# Patient Record
Sex: Female | Born: 1955 | Race: White | Hispanic: No | Smoking: Never smoker
Health system: Southern US, Community
[De-identification: ages and names within clinical notes are randomized; demographics above are authoritative.]

## PROBLEM LIST (undated history)

## (undated) DIAGNOSIS — I10 Essential (primary) hypertension: Secondary | ICD-10-CM

## (undated) HISTORY — PX: TEAR DUCT PROBING: SHX793

## (undated) HISTORY — DX: Essential (primary) hypertension: I10

---

## 2004-10-02 ENCOUNTER — Ambulatory Visit: Payer: Self-pay | Admitting: Family Medicine

## 2004-10-14 ENCOUNTER — Ambulatory Visit: Payer: Self-pay | Admitting: Family Medicine

## 2004-10-14 ENCOUNTER — Other Ambulatory Visit: Admission: RE | Admit: 2004-10-14 | Discharge: 2004-10-14 | Payer: Self-pay | Admitting: Family Medicine

## 2004-10-24 ENCOUNTER — Ambulatory Visit: Payer: Self-pay

## 2004-10-28 ENCOUNTER — Ambulatory Visit: Payer: Self-pay | Admitting: Family Medicine

## 2004-11-10 ENCOUNTER — Encounter: Admission: RE | Admit: 2004-11-10 | Discharge: 2004-11-10 | Payer: Self-pay | Admitting: Family Medicine

## 2004-11-13 ENCOUNTER — Ambulatory Visit: Payer: Self-pay | Admitting: Family Medicine

## 2004-11-18 ENCOUNTER — Encounter: Admission: RE | Admit: 2004-11-18 | Discharge: 2004-11-18 | Payer: Self-pay | Admitting: Family Medicine

## 2004-12-01 ENCOUNTER — Inpatient Hospital Stay (HOSPITAL_COMMUNITY): Admission: AD | Admit: 2004-12-01 | Discharge: 2004-12-02 | Payer: Self-pay | Admitting: Emergency Medicine

## 2004-12-01 ENCOUNTER — Ambulatory Visit: Payer: Self-pay | Admitting: Family Medicine

## 2004-12-02 ENCOUNTER — Encounter: Payer: Self-pay | Admitting: Cardiology

## 2004-12-17 ENCOUNTER — Ambulatory Visit: Payer: Self-pay | Admitting: Family Medicine

## 2005-03-03 ENCOUNTER — Ambulatory Visit: Payer: Self-pay | Admitting: Family Medicine

## 2005-06-11 ENCOUNTER — Ambulatory Visit: Payer: Self-pay | Admitting: Family Medicine

## 2005-06-16 ENCOUNTER — Ambulatory Visit: Payer: Self-pay | Admitting: Family Medicine

## 2005-09-21 ENCOUNTER — Ambulatory Visit: Payer: Self-pay | Admitting: Family Medicine

## 2005-09-22 ENCOUNTER — Ambulatory Visit: Payer: Self-pay | Admitting: Family Medicine

## 2005-09-24 ENCOUNTER — Ambulatory Visit: Payer: Self-pay | Admitting: Family Medicine

## 2006-02-02 ENCOUNTER — Ambulatory Visit: Payer: Self-pay | Admitting: Family Medicine

## 2006-06-28 ENCOUNTER — Encounter: Admission: RE | Admit: 2006-06-28 | Discharge: 2006-06-28 | Payer: Self-pay | Admitting: Family Medicine

## 2006-07-12 ENCOUNTER — Other Ambulatory Visit: Admission: RE | Admit: 2006-07-12 | Discharge: 2006-07-12 | Payer: Self-pay | Admitting: Family Medicine

## 2006-07-12 ENCOUNTER — Encounter: Payer: Self-pay | Admitting: Family Medicine

## 2006-07-12 ENCOUNTER — Ambulatory Visit: Payer: Self-pay | Admitting: Family Medicine

## 2006-07-12 LAB — CONVERTED CEMR LAB
AST: 17 units/L (ref 0–37)
CO2: 31 meq/L (ref 19–32)
Chloride: 104 meq/L (ref 96–112)
Creatinine, Ser: 1 mg/dL (ref 0.4–1.2)
Eosinophil percent: 2.9 % (ref 0.0–5.0)
GFR calc non Af Amer: 62 mL/min
Glucose, Bld: 107 mg/dL — ABNORMAL HIGH (ref 70–99)
HCT: 37.9 % (ref 36.0–46.0)
MCHC: 32.4 g/dL (ref 30.0–36.0)
MCV: 66.4 fL — ABNORMAL LOW (ref 78.0–100.0)
Monocytes Absolute: 0.2 10*3/uL (ref 0.2–0.7)
Neutro Abs: 3.6 10*3/uL (ref 1.4–7.7)
Neutrophils Relative %: 63.4 % (ref 43.0–77.0)
RBC: 5.7 M/uL — ABNORMAL HIGH (ref 3.87–5.11)
Sodium: 141 meq/L (ref 135–145)

## 2006-09-09 ENCOUNTER — Ambulatory Visit: Payer: Self-pay | Admitting: Internal Medicine

## 2006-09-23 ENCOUNTER — Ambulatory Visit: Payer: Self-pay | Admitting: Internal Medicine

## 2006-12-08 ENCOUNTER — Encounter: Payer: Self-pay | Admitting: Family Medicine

## 2006-12-08 DIAGNOSIS — I10 Essential (primary) hypertension: Secondary | ICD-10-CM | POA: Insufficient documentation

## 2007-06-30 ENCOUNTER — Encounter: Admission: RE | Admit: 2007-06-30 | Discharge: 2007-06-30 | Payer: Self-pay | Admitting: Family Medicine

## 2007-07-07 ENCOUNTER — Encounter (INDEPENDENT_AMBULATORY_CARE_PROVIDER_SITE_OTHER): Payer: Self-pay | Admitting: *Deleted

## 2007-08-09 ENCOUNTER — Ambulatory Visit: Payer: Self-pay | Admitting: Family Medicine

## 2007-08-16 ENCOUNTER — Telehealth (INDEPENDENT_AMBULATORY_CARE_PROVIDER_SITE_OTHER): Payer: Self-pay | Admitting: *Deleted

## 2007-08-24 ENCOUNTER — Ambulatory Visit: Payer: Self-pay | Admitting: Family Medicine

## 2007-08-24 DIAGNOSIS — J069 Acute upper respiratory infection, unspecified: Secondary | ICD-10-CM | POA: Insufficient documentation

## 2007-11-22 ENCOUNTER — Other Ambulatory Visit: Admission: RE | Admit: 2007-11-22 | Discharge: 2007-11-22 | Payer: Self-pay | Admitting: Family Medicine

## 2007-11-22 ENCOUNTER — Encounter: Payer: Self-pay | Admitting: Family Medicine

## 2007-11-22 ENCOUNTER — Ambulatory Visit: Payer: Self-pay | Admitting: Family Medicine

## 2007-11-28 ENCOUNTER — Encounter (INDEPENDENT_AMBULATORY_CARE_PROVIDER_SITE_OTHER): Payer: Self-pay | Admitting: *Deleted

## 2007-12-05 ENCOUNTER — Encounter (INDEPENDENT_AMBULATORY_CARE_PROVIDER_SITE_OTHER): Payer: Self-pay | Admitting: *Deleted

## 2008-01-19 ENCOUNTER — Encounter: Payer: Self-pay | Admitting: Family Medicine

## 2008-05-31 ENCOUNTER — Ambulatory Visit: Payer: Self-pay | Admitting: Family Medicine

## 2008-07-11 ENCOUNTER — Ambulatory Visit: Payer: Self-pay | Admitting: Family Medicine

## 2008-07-18 ENCOUNTER — Encounter (INDEPENDENT_AMBULATORY_CARE_PROVIDER_SITE_OTHER): Payer: Self-pay | Admitting: *Deleted

## 2008-07-23 ENCOUNTER — Encounter: Admission: RE | Admit: 2008-07-23 | Discharge: 2008-07-23 | Payer: Self-pay | Admitting: Family Medicine

## 2008-07-26 ENCOUNTER — Encounter (INDEPENDENT_AMBULATORY_CARE_PROVIDER_SITE_OTHER): Payer: Self-pay | Admitting: *Deleted

## 2008-11-23 ENCOUNTER — Ambulatory Visit: Payer: Self-pay | Admitting: Family Medicine

## 2008-11-24 ENCOUNTER — Encounter: Payer: Self-pay | Admitting: Family Medicine

## 2008-11-26 ENCOUNTER — Encounter (INDEPENDENT_AMBULATORY_CARE_PROVIDER_SITE_OTHER): Payer: Self-pay | Admitting: *Deleted

## 2008-12-03 ENCOUNTER — Ambulatory Visit: Payer: Self-pay | Admitting: Family Medicine

## 2008-12-03 ENCOUNTER — Encounter: Payer: Self-pay | Admitting: Family Medicine

## 2008-12-03 ENCOUNTER — Other Ambulatory Visit: Admission: RE | Admit: 2008-12-03 | Discharge: 2008-12-03 | Payer: Self-pay | Admitting: Family Medicine

## 2008-12-10 ENCOUNTER — Encounter (INDEPENDENT_AMBULATORY_CARE_PROVIDER_SITE_OTHER): Payer: Self-pay | Admitting: *Deleted

## 2009-05-21 ENCOUNTER — Ambulatory Visit: Payer: Self-pay | Admitting: Family Medicine

## 2009-05-21 DIAGNOSIS — S61209A Unspecified open wound of unspecified finger without damage to nail, initial encounter: Secondary | ICD-10-CM | POA: Insufficient documentation

## 2009-05-30 ENCOUNTER — Telehealth (INDEPENDENT_AMBULATORY_CARE_PROVIDER_SITE_OTHER): Payer: Self-pay | Admitting: *Deleted

## 2009-09-30 ENCOUNTER — Encounter: Admission: RE | Admit: 2009-09-30 | Discharge: 2009-09-30 | Payer: Self-pay | Admitting: Family Medicine

## 2009-10-01 ENCOUNTER — Encounter: Payer: Self-pay | Admitting: Family Medicine

## 2009-11-25 ENCOUNTER — Telehealth (INDEPENDENT_AMBULATORY_CARE_PROVIDER_SITE_OTHER): Payer: Self-pay | Admitting: *Deleted

## 2010-02-13 ENCOUNTER — Telehealth: Payer: Self-pay | Admitting: Family Medicine

## 2010-04-03 ENCOUNTER — Ambulatory Visit: Payer: Self-pay | Admitting: Family Medicine

## 2010-04-03 ENCOUNTER — Other Ambulatory Visit: Admission: RE | Admit: 2010-04-03 | Discharge: 2010-04-03 | Payer: Self-pay | Admitting: Family Medicine

## 2010-04-03 DIAGNOSIS — Z780 Asymptomatic menopausal state: Secondary | ICD-10-CM | POA: Insufficient documentation

## 2010-04-09 ENCOUNTER — Telehealth (INDEPENDENT_AMBULATORY_CARE_PROVIDER_SITE_OTHER): Payer: Self-pay | Admitting: *Deleted

## 2010-08-22 ENCOUNTER — Ambulatory Visit: Payer: Self-pay | Admitting: Family Medicine

## 2010-08-22 DIAGNOSIS — M25569 Pain in unspecified knee: Secondary | ICD-10-CM | POA: Insufficient documentation

## 2010-08-30 ENCOUNTER — Encounter: Payer: Self-pay | Admitting: Family Medicine

## 2010-10-05 ENCOUNTER — Encounter: Payer: Self-pay | Admitting: Family Medicine

## 2010-10-12 LAB — CONVERTED CEMR LAB
AST: 15 units/L (ref 0–37)
Albumin: 4.1 g/dL (ref 3.5–5.2)
Albumin: 4.3 g/dL (ref 3.5–5.2)
Albumin: 4.6 g/dL (ref 3.5–5.2)
BUN: 10 mg/dL (ref 6–23)
BUN: 13 mg/dL (ref 6–23)
BUN: 14 mg/dL (ref 6–23)
Basophils Relative: 0.5 % (ref 0.0–1.0)
Basophils Relative: 0.6 % (ref 0.0–3.0)
Bilirubin, Direct: 0.2 mg/dL (ref 0.0–0.3)
Bilirubin, Direct: 0.2 mg/dL (ref 0.0–0.3)
CO2: 31 meq/L (ref 19–32)
CO2: 32 meq/L (ref 19–32)
Calcium: 9 mg/dL (ref 8.4–10.5)
Calcium: 9.4 mg/dL (ref 8.4–10.5)
Chloride: 103 meq/L (ref 96–112)
Cholesterol: 167 mg/dL (ref 0–200)
Creatinine, Ser: 0.9 mg/dL (ref 0.4–1.2)
Creatinine, Ser: 0.9 mg/dL (ref 0.4–1.2)
Creatinine, Ser: 1 mg/dL (ref 0.4–1.2)
Direct LDL: 116.7 mg/dL
Eosinophils Absolute: 0.2 10*3/uL (ref 0.0–0.7)
Eosinophils Relative: 2 % (ref 0.0–5.0)
Estradiol: 25.5 pg/mL
GFR calc Af Amer: 75 mL/min
Glucose, Bld: 100 mg/dL — ABNORMAL HIGH (ref 70–99)
Glucose, Urine, Semiquant: NEGATIVE
HCT: 38.7 % (ref 36.0–46.0)
HDL: 40.3 mg/dL (ref >39.0)
Hemoglobin: 12.7 g/dL (ref 12.0–15.0)
Hgb A1c MFr Bld: 6.1 % (ref 4.6–6.5)
Ketones, urine, test strip: NEGATIVE
LDL Cholesterol: 101 mg/dL — ABNORMAL HIGH (ref 0–99)
MCHC: 32.3 g/dL (ref 30.0–36.0)
MCV: 65.4 fL — ABNORMAL LOW (ref 78.0–100.0)
MCV: 67.4 fL — ABNORMAL LOW (ref 78.0–100.0)
Monocytes Absolute: 0.3 10*3/uL (ref 0.1–1.0)
Monocytes Absolute: 0.3 10*3/uL (ref 0.1–1.0)
Monocytes Relative: 5.1 % (ref 3.0–12.0)
Monocytes Relative: 5.7 % (ref 3.0–11.0)
Neutro Abs: 3.6 10*3/uL (ref 1.4–7.7)
Neutrophils Relative %: 62 % (ref 43.0–77.0)
Nitrite: NEGATIVE
Platelets: 336 10*3/uL (ref 150–400)
RBC: 5.7 M/uL — ABNORMAL HIGH (ref 3.87–5.11)
RBC: 5.96 M/uL — ABNORMAL HIGH (ref 3.87–5.11)
RDW: 13.7 % (ref 11.5–14.6)
Specific Gravity, Urine: <1.005
TSH: 1.44 microintl units/mL (ref 0.35–5.50)
TSH: 1.51 microintl units/mL (ref 0.35–5.50)
TSH: 1.83 microintl units/mL (ref 0.35–5.50)
Total CHOL/HDL Ratio: 4
Total CHOL/HDL Ratio: 4.2
Total Protein: 7 g/dL (ref 6.0–8.3)
Total Protein: 7.2 g/dL (ref 6.0–8.3)
Triglycerides: 79 mg/dL (ref 0–149)
Triglycerides: 85 mg/dL (ref 0.0–149.0)
Triglycerides: 98 mg/dL (ref 0–149)
VLDL: 20 mg/dL (ref 0–40)
WBC: 5.7 10*3/uL (ref 4.5–10.5)
pH: 8

## 2010-10-14 NOTE — Progress Notes (Signed)
Summary: Refill Requests/appt  Phone Note Refill Request Call back at [phone] Message from:  Fax from Pharmacy on February 13, 2010 11:11 AM  Refills Requested: Medication #1:  MAXZIDE-25 37.5-25 MG  TABS 1 by mouth once daily.   Dosage confirmed as above?Dosage Confirmed   Brand Name Necessary? No   Supply Requested: 3 months   Last Refilled: 11/2008  Medication #2:  ACEON 4 MG  TABS 1 1/2 tabs by mouth once daily   Dosage confirmed as above?Dosage Confirmed   Brand Name Necessary? No   Supply Requested: 3 months [facility]  Next Appointment Scheduled: 7.21.11 Initial call taken by: Harold Barban,  February 13, 2010 11:11 AM  Follow-up for Phone Call        last ov- 05/2009. Army Fossa CMA  February 13, 2010 11:19 AM   Additional Follow-up for Phone Call Additional follow up Details #1::        pt needs appointment scheduled--- then refill x1 each Additional Follow-up by: Loreen Freud DO,  February 13, 2010 11:53 AM    Additional Follow-up for Phone Call Additional follow up Details #2::    Pt has appt April 03 2010. Army Fossa CMA  February 13, 2010 12:45 PM   Prescriptions: MAXZIDE-25 37.5-25 MG  TABS (TRIAMTERENE-HCTZ) 1 by mouth once daily  #90 x 0   Entered by:   Army Fossa CMA   Authorized by:   Loreen Freud DO   Signed by:   Army Fossa CMA on 02/13/2010   Method used:   Faxed to ...       [facility]       [address] [place], [place]  [address]       Ph: [phone]       Fax: [phone]   RxID:   [id number] ACEON 4 MG  TABS (PERINDOPRIL ERBUMINE) 1 1/2 tabs by mouth once daily  #135 x 0   Entered by:   Army Fossa CMA   Authorized by:   Loreen Freud DO   Signed by:   Army Fossa CMA on 02/13/2010   Method used:   Faxed to ...       [facility]       [address] [place], [place]  [address]       Ph: [phone]       Fax: [phone]   RxID:   [id number]

## 2010-10-14 NOTE — Progress Notes (Signed)
Summary:  lab results  Phone Note Outgoing Call   Details for Reason: estradiol--  postmenopausal vita d low-----vita D 50000 weekly #4  2 refills and take vita D3 2000u daily----recheck 3months  Summary of Call: left message to call  office......Marland KitchenFelecia Deloach CMA  April 09, 2010 2:50 PM  left message to call  Office.Marland KitchenMarland KitchenMarland KitchenFelecia Deloach CMA  April 10, 2010 12:56 PM     New/Updated Medications: VITAMIN D (ERGOCALCIFEROL) 50000 UNIT CAPS (ERGOCALCIFEROL) Take 1 tab  weekly Prescriptions: VITAMIN D (ERGOCALCIFEROL) 50000 UNIT CAPS (ERGOCALCIFEROL) Take 1 tab  weekly  #4 x 2   Entered by:   Jeremy Johann CMA   Authorized by:   Loreen Freud DO   Signed by:   Jeremy Johann CMA on 04/10/2010   Method used:   Faxed to ...       [facility] [address] [id number] (retail)       [address] [place], [place]  [address]       Ph: [phone]       Fax: [phone]   RxID:   [id number]

## 2010-10-14 NOTE — Assessment & Plan Note (Signed)
Summary: fell middle of nov-hurt left knee--still swollen...sph   Vital Signs:  Patient profile:   55 year old female Menstrual status:  regular Weight:      261.2 pounds Temp:     97.9 degrees F oral BP sitting:   126 / 76  (left arm) Cuff size:   large  Vitals Entered By: Almeta Monas CMA Duncan Dull) (August 22, 2010 10:38 AM) CC: c/o swelling to the left knee after a fall x3weeks ago   History of Present Illness:  Injury      This is a 55 year old woman who presents with An injury.  The symptoms began 3 weeks ago.  Pt fell about 3 weeks ago in parking lot in rain and L knee still swelling.   No pain but it feels tight.  The patient reports injury to the left knee.  The patient also reports swelling.  The patient denies redness, tenderness, increased warmth deformity, blood loss, numbness, weakness, loss of sensation, coolness of extremity, and loss of consciousness.  The patient denies the following risk factors for significant bleeding: aspirin use, anticoagulant use, and history of bleeding disorder.  Screening for risk of abuse was negative.    Current Medications (verified): 1)  Aceon 4 Mg  Tabs (Perindopril Erbumine) .Marland Kitchen.. 1 1/2 Tabs By Mouth Once Daily 2)  Maxzide-25 37.5-25 Mg  Tabs (Triamterene-Hctz) .Marland Kitchen.. 1 By Mouth Once Daily 3)  Vitamin D (Ergocalciferol) 50000 Unit Caps (Ergocalciferol) .... Take 1 Tab  Weekly  Allergies (verified): 1)  ! Penicillin  Past History:  Past Medical History: Last updated: 12/08/2006 Hypertension  Past Surgical History: Last updated: 11/28/08 Denies surgical history tear duct surgery  Family History: Last updated: 2008-11-28 MOTHER:LIVING FATHER:DECEASED SISTERS:3 BROTHERS: NONE Family History Hypertension: 2 SISTERS, MOTHER, FATHER Family History Diabetes 1st degree relative MGM-- angina,  MI?  Social History: Last updated: 11/22/2007 Divorced Occupation: accounting  Never Smoked Alcohol use-yes Drug use-no Regular  exercise-no  Risk Factors: Alcohol Use: <1 (04/03/2010) Caffeine Use: 2 (04/03/2010) Diet: pt not following any kind of diet (04/03/2010) Exercise: no (04/03/2010)  Risk Factors: Smoking Status: never (04/03/2010) Passive Smoke Exposure: no (04/03/2010)  Family History: Reviewed history from 11/28/2008 and no changes required. MOTHER:LIVING FATHER:DECEASED SISTERS:3 BROTHERS: NONE Family History Hypertension: 2 SISTERS, MOTHER, FATHER Family History Diabetes 1st degree relative MGM-- angina,  MI?  Social History: Reviewed history from 11/22/2007 and no changes required. Divorced Occupation: Audiological scientist  Never Smoked Alcohol use-yes Drug use-no Regular exercise-no  Review of Systems      See HPI  Physical Exam  General:  Well-developed,well-nourished,in no acute distress; alert,appropriate and cooperative throughout examination Msk:  L knee swelling  no pain no joint warmth and no redness over joints.   Neurologic:  alert & oriented X3 and strength normal in all extremities.   Psych:  Oriented X3 and normally interactive.     Impression & Recommendations:  Problem # 1:  KNEE PAIN, LEFT (ICD-719.46)  Orders: T-Knee Left 2 view (73560TC)  Discussed strengthening exercises, use of ice or heat, and medications.   Complete Medication List: 1)  Aceon 4 Mg Tabs (Perindopril erbumine) .Marland Kitchen.. 1 1/2 tabs by mouth once daily 2)  Maxzide-25 37.5-25 Mg Tabs (Triamterene-hctz) .Marland Kitchen.. 1 by mouth once daily 3)  Vitamin D (ergocalciferol) 50000 Unit Caps (Ergocalciferol) .... Take 1 tab  weekly   Orders Added: 1)  T-Knee Left 2 view [73560TC] 2)  Est. Patient Level III [[address]]

## 2010-10-14 NOTE — Assessment & Plan Note (Signed)
Summary: Marissa Leblanc   Vital Signs:  Patient profile:   55 year old female Menstrual status:  regular Height:      66.25 inches Weight:      257 pounds BMI:     41.32 Temp:     98.6 degrees F oral Pulse rate:   76 / minute Resp:     18 per minute BP sitting:   130 / 82  (left arm)  Vitals Entered By: Jeremy Johann CMA (April 03, 2010 9:30 AM) CC: cpx,fasting, pap   History of Present Illness: Pt here for cpe, pap and labs. No complaints.   Hypertension follow-up      This is a 55 year old woman who presents for Hypertension follow-up.  The patient denies lightheadedness, urinary frequency, headaches, edema, impotence, rash, and fatigue.  The patient denies the following associated symptoms: chest pain, chest pressure, exercise intolerance, dyspnea, palpitations, syncope, leg edema, and pedal edema.  Compliance with medications (by patient report) has been near 100%.  The patient reports that dietary compliance has been fair.  The patient reports no exercise.  Adjunctive measures currently used by the patient include salt restriction.    Preventive Screening-Counseling & Management  Alcohol-Tobacco     Alcohol drinks/day: <1     Alcohol type: rare     Smoking Status: never     Passive Smoke Exposure: no  Caffeine-Diet-Exercise     Caffeine use/day: 2     Caffeine Counseling: not indicated; caffeine use is not excessive or problematic     Diet Comments: pt not following any kind of diet     Diet Counseling: to improve diet; diet is suboptimal     Does Patient Exercise: no     Exercise Counseling: to improve exercise regimen  Hep-HIV-STD-Contraception     Hepatitis Risk: no risk noted     HIV Risk: no     STD Risk: no risk noted     Dental Visit-last 6 months yes     Dental Care Counseling: not indicated; dental care within six months     SBE monthly: no     SBE Education/Counseling: to perform regular SBE     Sun Exposure-Excessive: no     Sun Exposure Counseling: not  indicated; sun exposure is acceptable  Safety-Violence-Falls     Seat Belt Use: yes     Seat Belt Counseling: not indicated; patient wears seat belts     Firearms in the Home: no firearms in the home     Firearm Counseling: not applicable     Smoke Detectors: yes     Smoke Detector Counseling: no     Violence in the Home: no risk noted     Violence Counseling: not applicable     Sexual Abuse: no     Sexual Abuse Counseling: no     Fall Risk: no      Sexual History:  currently monogamous.    Current Medications (verified): 1)  Aceon 4 Mg  Tabs (Perindopril Erbumine) .Marland Kitchen.. 1 1/2 Tabs By Mouth Once Daily 2)  Maxzide-25 37.5-25 Mg  Tabs (Triamterene-Hctz) .Marland Kitchen.. 1 By Mouth Once Daily  Allergies: 1)  ! Penicillin  Past History:  Past Medical History: Last updated: 12/08/2006 Hypertension  Past Surgical History: Last updated: December 02, 2008 Denies surgical history tear duct surgery  Family History: Last updated: 12-02-08 MOTHER:LIVING FATHER:DECEASED SISTERS:3 BROTHERS: NONE Family History Hypertension: 2 SISTERS, MOTHER, FATHER Family History Diabetes 1st degree relative MGM-- angina,  MI?  Social History:  Last updated: 11/22/2007 Divorced Occupation: accounting  Never Smoked Alcohol use-yes Drug use-no Regular exercise-no  Risk Factors: Alcohol Use: <1 (04/03/2010) Caffeine Use: 2 (04/03/2010) Diet: pt not following any kind of diet (04/03/2010) Exercise: no (04/03/2010)  Risk Factors: Smoking Status: never (04/03/2010) Passive Smoke Exposure: no (04/03/2010)  Family History: Reviewed history from 11/23/2008 and no changes required. MOTHER:LIVING FATHER:DECEASED SISTERS:3 BROTHERS: NONE Family History Hypertension: 2 SISTERS, MOTHER, FATHER Family History Diabetes 1st degree relative MGM-- angina,  MI?  Social History: Reviewed history from 11/22/2007 and no changes required. Divorced Occupation: Audiological scientist  Never Smoked Alcohol use-yes Drug  use-no Regular exercise-no Does Patient Exercise:  no  Review of Systems      See HPI General:  Denies chills, fatigue, fever, loss of appetite, malaise, sleep disorder, sweats, weakness, and weight loss. Eyes:  Denies blurring, discharge, double vision, eye irritation, eye pain, halos, itching, light sensitivity, red eye, vision loss-1 eye, and vision loss-both eyes; optho q1y + contacts. ENT:  Denies decreased hearing, difficulty swallowing, ear discharge, earache, hoarseness, nasal congestion, nosebleeds, postnasal drainage, ringing in ears, sinus pressure, and sore throat. CV:  Denies bluish discoloration of lips or nails, chest pain or discomfort, difficulty breathing at night, difficulty breathing while lying down, fainting, fatigue, leg cramps with exertion, lightheadness, near fainting, palpitations, shortness of breath with exertion, swelling of feet, swelling of hands, and weight gain. Resp:  Denies chest discomfort, chest pain with inspiration, cough, coughing up blood, excessive snoring, hypersomnolence, morning headaches, pleuritic, shortness of breath, sputum productive, and wheezing. GI:  Denies abdominal pain, bloody stools, change in bowel habits, constipation, dark tarry stools, diarrhea, excessive appetite, gas, hemorrhoids, indigestion, and loss of appetite. GU:  Denies abnormal vaginal bleeding, decreased libido, discharge, dysuria, genital sores, hematuria, incontinence, nocturia, urinary frequency, and urinary hesitancy. MS:  Denies joint pain, joint redness, joint swelling, loss of strength, low back pain, mid back pain, muscle aches, muscle , cramps, muscle weakness, stiffness, and thoracic pain. Derm:  Denies changes in color of skin, changes in nail beds, dryness, excessive perspiration, flushing, hair loss, insect bite(s), itching, lesion(s), poor wound healing, and rash. Neuro:  Denies brief paralysis, difficulty with concentration, disturbances in coordination, falling  down, headaches, inability to speak, memory loss, numbness, poor balance, seizures, sensation of room spinning, tingling, tremors, visual disturbances, and weakness. Psych:  Denies alternate hallucination ( auditory/visual), anxiety, depression, easily angered, easily tearful, irritability, mental problems, panic attacks, sense of great danger, suicidal thoughts/plans, thoughts of violence, unusual visions or sounds, and thoughts /plans of harming others. Endo:  Denies cold intolerance, excessive hunger, excessive thirst, excessive urination, heat intolerance, polyuria, and weight change. Heme:  Denies abnormal bruising, bleeding, enlarge lymph nodes, fevers, pallor, and skin discoloration. Allergy:  Denies hives or rash, itching eyes, persistent infections, seasonal allergies, and sneezing.  Physical Exam  General:  Well-developed,well-nourished,in no acute distress; alert,appropriate and cooperative throughout examinationoverweight-appearing.   Head:  Normocephalic and atraumatic without obvious abnormalities. No apparent alopecia or balding. Eyes:  vision grossly intact, pupils equal, pupils round, pupils reactive to light, and no injection.   Ears:  External ear exam shows no significant lesions or deformities.  Otoscopic examination reveals clear canals, tympanic membranes are intact bilaterally without bulging, retraction, inflammation or discharge. Hearing is grossly normal bilaterally. Nose:  External nasal examination shows no deformity or inflammation. Nasal mucosa are pink and moist without lesions or exudates. Mouth:  Oral mucosa and oropharynx without lesions or exudates.  Teeth in good repair. Neck:  No  deformities, masses, or tenderness noted. Chest Wall:  No deformities, masses, or tenderness noted. Breasts:  No mass, nodules, thickening, tenderness, bulging, retraction, inflamation, nipple discharge or skin changes noted.   Lungs:  Normal respiratory effort, chest expands  symmetrically. Lungs are clear to auscultation, no crackles or wheezes. Heart:  normal rate and no murmur.   Abdomen:  Bowel sounds positive,abdomen soft and non-tender without masses, organomegaly or hernias noted. Rectal:  No external abnormalities noted. Normal sphincter tone. No rectal masses or tenderness.  heme negative brown stool Genitalia:  Pelvic Exam:        External: normal female genitalia without lesions or masses        Vagina: normal without lesions or masses        Cervix: normal without lesions or masses        Adnexa: normal bimanual exam without masses or fullness        Uterus: normal by palpation        Pap smear: performed Msk:  normal ROM, no joint tenderness, no joint swelling, no joint warmth, no redness over joints, no joint deformities, no joint instability, and no crepitation.   Pulses:  R posterior tibial normal, R dorsalis pedis normal, R carotid normal, L posterior tibial normal, L dorsalis pedis normal, and L carotid normal.   Extremities:  No clubbing, cyanosis, edema, or deformity noted with normal full range of motion of all joints.   Neurologic:  No cranial nerve deficits noted. Station and gait are normal. Plantar reflexes are down-going bilaterally. DTRs are symmetrical throughout. Sensory, motor and coordinative functions appear intact. Skin:  Intact without suspicious lesions or rashes Cervical Nodes:  No lymphadenopathy noted Axillary Nodes:  No palpable lymphadenopathy Psych:  Cognition and judgment appear intact. Alert and cooperative with normal attention span and concentration. No apparent delusions, illusions, hallucinations   Impression & Recommendations:  Problem # 1:  PREVENTIVE HEALTH CARE (ICD-V70.0) ghm utd  Orders: Venipuncture ([address]) TLB-Lipid Panel (80061-LIPID) TLB-BMP (Basic Metabolic Panel-BMET) (80048-METABOL) TLB-CBC Platelet - w/Differential (85025-CBCD) TLB-Hepatic/Liver Function Pnl (80076-HEPATIC) TLB-TSH (Thyroid  Stimulating Hormone) (84443-TSH) EKG w/ Interpretation (93[address])  Problem # 2:  POSTMENOPAUSAL STATUS (ICD-V49.81)  Orders: TLB-FSH (Follicle Stimulating Hormone) (83001-FSH) TLB-Luteinizing Hormone (LH) (83002-LH) T- * Misc. Laboratory test [phone]) T-Vitamin D (25-Hydroxy) [phone]) EKG w/ Interpretation (93[address])  Problem # 3:  FAMILY HISTORY DIABETES 1ST DEGREE RELATIVE (ICD-V18.0)  Orders: TLB-A1C / Hgb A1C (Glycohemoglobin) (83036-A1C)  Problem # 4:  HYPERTENSION (ICD-401.9)  Her updated medication list for this problem includes:    Aceon 4 Mg Tabs (Perindopril erbumine) .Marland Kitchen... 1 1/2 tabs by mouth once daily    Maxzide-25 37.5-25 Mg Tabs (Triamterene-hctz) .Marland Kitchen... 1 by mouth once daily  Orders: Venipuncture ([address]) TLB-Lipid Panel (80061-LIPID) TLB-BMP (Basic Metabolic Panel-BMET) (80048-METABOL) TLB-CBC Platelet - w/Differential (85025-CBCD) TLB-Hepatic/Liver Function Pnl (80076-HEPATIC) TLB-TSH (Thyroid Stimulating Hormone) (84443-TSH) EKG w/ Interpretation (93000)  BP today: 130/82 Prior BP: 120/80 (05/21/2009)  Labs Reviewed: K+: 3.7 (11/23/2008) Creat: : 1.0 (11/23/2008)   Chol: 154 (11/23/2008)   HDL: 36.7 (11/23/2008)   LDL: 102 (11/23/2008)   TG: 79 (11/23/2008)  Complete Medication List: 1)  Aceon 4 Mg Tabs (Perindopril erbumine) .Marland Kitchen.. 1 1/2 tabs by mouth once daily 2)  Maxzide-25 37.5-25 Mg Tabs (Triamterene-hctz) .Marland Kitchen.. 1 by mouth once daily  Other Orders: TLB-Cholesterol, Direct LDL (83721-DIRLDL) Prescriptions: ACEON 4 MG  TABS (PERINDOPRIL ERBUMINE) 1 1/2 tabs by mouth once daily  #135 x 3   Entered and Authorized by:   Myrene Buddy  Lowne DO   Signed by:   Loreen Freud DO on 04/03/2010   Method used:   Faxed to ...       [facility] ([facility]       [address] [place], [place]  [address]       Ph: [phone]       Fax: [phone]   RxID:   [id number] MAXZIDE-25 37.5-25 MG  TABS (TRIAMTERENE-HCTZ) 1 by mouth once daily  #90 x 3   Entered  and Authorized by:   Loreen Freud DO   Signed by:   Loreen Freud DO on 04/03/2010   Method used:   Faxed to ...       [facility] (mail[facility]order)       [address] [place], [place]  [address]       Ph: [phone]       Fax: [phone]   RxID:   [id number]    EKG  Procedure date:  04/03/2010  Findings:      Normal sinus rhythm with rate of:  70Right bundle branch block.     Last Flu Vaccine:  Fluvax 3+ (07/11/2008 4:04:57 PM) Flu Vaccine Result Date:  06/27/2009 Flu Vaccine Result:  given Flu Vaccine Next Due:  1 yr Last Mammogram:  ASSESSMENT: Negative - BI-RADS 1^MM DIGITAL SCREENING (09/30/2009 5:44:00 PM) Mammogram Result Date:  09/30/2009 Mammogram Result:  normal Mammogram Next Due:  1 yr  Appended Document: Marissa Leblanc    Clinical Lists Changes  Orders: Added new Service order of Specimen Handling (88416) - Signed Added new Test order of TLB-Cholesterol, Direct LDL (83721-DIRLDL) - Signed      Appended Document: Marissa Leblanc    Clinical Lists Changes  Orders: Added new Service order of UA Dipstick w/o Micro (manual) (60630) - Signed Observations: Added new observation of PH URINE: 8.0  (04/03/2010 13:09) Added new observation of SPEC GR URIN: 1.010  (04/03/2010 13:09) Added new observation of WBC DIPSTK U: negative  (04/03/2010 13:09) Added new observation of NITRITE URN: negative  (04/03/2010 13:09) Added new observation of UROBILINOGEN: 0.2  (04/03/2010 13:09) Added new observation of PROTEIN, URN: negative  (04/03/2010 13:09) Added new observation of BLOOD UR DIP: negative  (04/03/2010 13:09) Added new observation of KETONES URN: negative  (04/03/2010 13:09) Added new observation of BILIRUBIN UR: negative  (04/03/2010 13:09) Added new observation of GLUCOSE, URN: negative  (04/03/2010 13:09)      Laboratory Results   Urine Tests    Routine Urinalysis   Glucose: negative   (Normal Range: Negative) Bilirubin: negative   (Normal Range:  Negative) Ketone: negative   (Normal Range: Negative) Spec. Gravity: 1.010   (Normal Range: 1.003-1.035) Blood: negative   (Normal Range: Negative) pH: 8.0   (Normal Range: 5.0-8.0) Protein: negative   (Normal Range: Negative) Urobilinogen: 0.2   (Normal Range: 0-1) Nitrite: negative   (Normal Range: Negative) Leukocyte Esterace: negative   (Normal Range: Negative)

## 2010-10-14 NOTE — Letter (Signed)
Summary: Letter Regarding Potassium Monitoring/[employer]  Letter Regarding Potassium Monitoring/[employer]   Imported By: Lanelle Bal 11/14/2009 13:56:16  _____________________________________________________________________  External Attachment:    Type:   Image     Comment:   External Document

## 2010-10-14 NOTE — Progress Notes (Signed)
Summary: Refill Request  Phone Note Refill Request Message from:  Pharmacy on [facility] Fax #: [phone]  Refills Requested: Medication #1:  MAXZIDE-25 37.5-25 MG  TABS 1 by mouth once daily.   Dosage confirmed as above?Dosage Confirmed   Supply Requested: 3 months  Medication #2:  ACEON 4 MG  TABS 1 1/2 tabs by mouth once daily   Dosage confirmed as above?Dosage Confirmed   Supply Requested: 3 months Next Appointment Scheduled: none Initial call taken by: Harold Barban,  November 25, 2009 9:10 AM    Prescriptions: MAXZIDE-25 37.5-25 MG  TABS (TRIAMTERENE-HCTZ) 1 by mouth once daily  #90 x 0   Entered by:   Army Fossa CMA   Authorized by:   Loreen Freud DO   Signed by:   Army Fossa CMA on 11/25/2009   Method used:   Faxed to ...       [facility] (mail[facility]order)       [address] [place], [place]  [address]       Ph: [phone]       Fax: [phone]   RxID:   [id number] ACEON 4 MG  TABS (PERINDOPRIL ERBUMINE) 1 1/2 tabs by mouth once daily  #135 x 0   Entered by:   Army Fossa CMA   Authorized by:   Loreen Freud DO   Signed by:   Army Fossa CMA on 11/25/2009   Method used:   Faxed to ...       [facility] (mail[facility]order)       [address] [place], [place]  [address]       Ph: [phone]       Fax: [phone]   RxID:   [id number]

## 2010-10-16 NOTE — Consult Note (Signed)
Summary: [facility]  [facility]   Imported By: Lanelle Bal 09/11/2010 09:28:42  _____________________________________________________________________  External Attachment:    Type:   Image     Comment:   External Document

## 2010-10-31 ENCOUNTER — Other Ambulatory Visit: Payer: Self-pay | Admitting: Family Medicine

## 2010-10-31 DIAGNOSIS — Z1231 Encounter for screening mammogram for malignant neoplasm of breast: Secondary | ICD-10-CM

## 2010-11-17 ENCOUNTER — Encounter: Payer: Self-pay | Admitting: Family Medicine

## 2010-11-17 ENCOUNTER — Ambulatory Visit
Admission: RE | Admit: 2010-11-17 | Discharge: 2010-11-17 | Disposition: A | Discharge status: Home / Self Care | Payer: BC Managed Care – PPO | Source: Ambulatory Visit | Attending: Family Medicine | Admitting: Family Medicine

## 2010-11-17 DIAGNOSIS — Z1231 Encounter for screening mammogram for malignant neoplasm of breast: Secondary | ICD-10-CM

## 2011-01-30 NOTE — Consult Note (Signed)
Marissa Leblanc, Marissa Leblanc               ACCOUNT NO.:  [id number]   MEDICAL RECORD NO.:  [id number]          PATIENT TYPE:  INP   LOCATION:  3713                         FACILITY:  [facility]   PHYSICIAN:  Charlton Haws, M.D.     DATE OF BIRTH:  09/09/1956   DATE OF CONSULTATION:  DATE OF DISCHARGE:                                   CONSULTATION   REFERRING PHYSICIAN:  Dr. Lelon Perla.   REASON FOR ADMISSION:  Marissa Leblanc is a 55 year old patient admitted by our  primary care service for chest pain; she has had this over the last 5 days;  it is atypical pain that started up in the left scapular and neck area; it  has radiated down the left side and around the breast; it seems  musculoskeletal in nature.   There are no GI overtones.  It is not clearly exertional.   There has been variable relief with nitroglycerin.   The patient's initial sets of enzymes were negative.  She actually had a  normal stress Myoview on October 24, 2004 with just breast attenuation and  an EF of 74%.   REVIEW OF SYSTEMS:  Her review of systems is otherwise remarkable for  occasional shortness of breath.  She has not had a cough or fever.  She does  not smoke.  She has had no increased lower extremity edema or pain in her  legs.   She has had a fairly recent normal screening mammogram done November 10, 2004 and has not had any other musculoskeletal problems in terms of  arthritis or connective tissue disease.   Review of systems is otherwise unremarkable.   ALLERGIES:  She has no known allergies.  There is distant history of  hypertension and migraines.   MEDICATIONS:  She was on Maxzide 25 mg a day and Aceon 4 mg a day prior to  admission.   SOCIAL HISTORY:  She drinks socially.  She does not smoke.  [name] divorced.  [name] an Nutritional therapist.   FAMILY HISTORY:  Family history is remarkable for diabetes on the mother's  side as well as hypertension.   PHYSICAL EXAMINATION:  VITAL SIGNS:   On exam, blood pressure is 130/60,  pulse 70 and regular.  LUNGS:  Clear.  CARDIAC:  Carotids are normal.  There is an S1 and S2 with normal heart  sounds.  ABDOMEN:  Abdomen is benign.  EXTREMITIES:  Lower extremities have intact pulses and no edema.  NECK:  Thyroid is not palpable.   LABORATORY AND ACCESSORY CLINICAL DATA:  EKG shows no acute changes and is  remarkable for a right bundle branch block.   Chest x-ray is normal with normal mediastinal contours, normal heart size  and no acute lung pathology.   Enzymes are negative x2.  Her MCV is a little low at 63.   IMPRESSION:  The patient's pain is atypical; I suspect it is  musculoskeletal.  She has been given a trial of Toradol.  She needs an  outpatient workup in regards to her low MCV with screening  esophagogastroduodenoscopy  and colonoscopy.   She will have a 2-D echocardiogram; if there are no regional wall motion  abnormalities, I do not think she needs any further cardiac workup.   We will order a chest CT to rule out any other mediastinal abnormalities,  given the significant nature of this pain.   If her echocardiogram and CT chest are normal, I suspect she can be  discharged later today for outpatient gastrointestinal workup.      PN/MEDQ  D:  12/02/2004  T:  12/02/2004  Job:  [id number]

## 2011-01-30 NOTE — H&P (Signed)
Marissa Leblanc, Marissa Leblanc               ACCOUNT NO.:  [id number]   MEDICAL RECORD NO.:  [id number]          PATIENT TYPE:  INP   LOCATION:  3799                         FACILITY:  [facility]   PHYSICIAN:  Lelon Perla, M.D.  DATE OF BIRTH:  [birth year]   DATE OF ADMISSION:  12/01/2004  DATE OF DISCHARGE:                                HISTORY & PHYSICAL   ADMITTING DIAGNOSIS:  Chest pain, rule out myocardial infarction.   HISTORY OF PRESENT ILLNESS:  The patient is a 55 year old white female who  presented to the office today with a 5-day history of chest pain that  started with left-sided neck pain and radiated to the shoulder blade and  wrapped around to the chest and under the left breast.  The patient states  the pain was a 2/10 today, and was a 7/10 last night, but no shortness of  breath, diaphoresis.  No associated with food.  It became very severe last  night and earlier this morning.  The patient denies any recent injury.   PAST MEDICAL HISTORY:  Significant for hypertension and migraines.   ALLERGIES:  No known drug allergies.   MEDICATIONS:  1.  Maxzide 25 mg daily.  2.  Aceon 4 mg daily.  3.  Topamax.  [name] just recently finishing a starter pack, so she is      taking 25 mg in the morning and 50 mg at night.   SOCIAL HISTORY:  She denies any smoking.  She drinks socially.  [name]  divorced.  She works as an Airline pilot.   FAMILY HISTORY:  Her mother and sister have type 2 diabetes.  Her mother, 2  sisters, and father have hypertension.  Her maternal grandmother had angina  and an MI.   PHYSICAL EXAMINATION:  VITAL SIGNS:  Blood pressure was 132/80, pulse 92,  respirations 20, temperature 98.7, weight 233.25 pounds.  GENERAL:  [name] awake, alert, and oriented, in no acute distress here in  the office.  HEENT:  Head is normocephalic and atraumatic.  Pupils equal, round and  reactive to light.  Tympanic membranes are intact bilaterally.  Oropharynx  was clear.  NECK:   Supple.  No JVD, no bruit.  HEART:  Regular rate and rhythm.  S1 and S2.  No murmurs were appreciated.  LUNGS:  Clear bilaterally.  No rales, rhonchi, or wheezing.  ABDOMEN:  Soft, nontender.  No organomegaly.  EXTREMITIES:  No clubbing, cyanosis, or edema.  NEUROLOGIC:  Cranial nerves II-XII were intact.  EKG showed a right bundle  branch block with inverted T waves in V1, 2, and 3, which were changed from  previous EKG in January.   ASSESSMENT AND PLAN:  1.  Chest pain, rule out myocardial infarction.  __________ CPKs x3, consult      cardiology, add Lopressor, and check a 2-D echocardiogram.  2.  History of hypertension.  Will continue with the Maxzide and Aceon.      YRL/MEDQ  D:  12/01/2004  T:  12/01/2004  Job:  [id number]

## 2011-06-02 ENCOUNTER — Ambulatory Visit (INDEPENDENT_AMBULATORY_CARE_PROVIDER_SITE_OTHER): Payer: BC Managed Care – PPO | Admitting: Family Medicine

## 2011-06-02 ENCOUNTER — Other Ambulatory Visit (HOSPITAL_COMMUNITY)
Admission: RE | Admit: 2011-06-02 | Discharge: 2011-06-02 | Disposition: A | Discharge status: Home / Self Care | Payer: BC Managed Care – PPO | Source: Ambulatory Visit | Attending: Family Medicine | Admitting: Family Medicine

## 2011-06-02 ENCOUNTER — Encounter: Payer: Self-pay | Admitting: Family Medicine

## 2011-06-02 VITALS — BP 132/80 | HR 70 | Temp 98.9°F | Wt 256.2 lb

## 2011-06-02 DIAGNOSIS — Z01419 Encounter for gynecological examination (general) (routine) without abnormal findings: Secondary | ICD-10-CM | POA: Insufficient documentation

## 2011-06-02 DIAGNOSIS — I10 Essential (primary) hypertension: Secondary | ICD-10-CM

## 2011-06-02 DIAGNOSIS — Z0000 Encounter for general adult medical examination without abnormal findings: Secondary | ICD-10-CM

## 2011-06-02 MED ORDER — PERINDOPRIL ERBUMINE 4 MG PO TABS
6.0000 mg | ORAL_TABLET | Freq: Every day | ORAL | Status: DC
Start: 2011-06-02 — End: 2011-06-23

## 2011-06-02 MED ORDER — TRIAMTERENE-HCTZ 37.5-25 MG PO TABS: 1.0000 | ORAL_TABLET | Freq: Every day | ORAL | Status: DC

## 2011-06-02 NOTE — Patient Instructions (Signed)

## 2011-06-02 NOTE — Progress Notes (Signed)
Subjective:     LIBBI TOWNER is a 55 y.o. female and is here for a comprehensive physical exam. The patient reports no problems.  History   Social History  . Marital Status: Divorced    Spouse Name: N/A    Number of Children: N/A  . Years of Education: N/A   Occupational History  . [employer]    Social History Main Topics  . Smoking status: Never Smoker   . Smokeless tobacco: Never Used  . Alcohol Use: No  . Drug Use: No  . Sexually Active: Not Currently -- Female partner(s)   Other Topics Concern  . Not on file   Social History Narrative  . No narrative on file   Health Maintenance  Topic Date Due  . Pap Smear  05/29/1974  . Influenza Vaccine  06/15/2011  . Mammogram  11/16/2012  . Colonoscopy  06/01/2016  . Tetanus/tdap  07/12/2016    The following portions of the patient's history were reviewed and updated as appropriate: allergies, current medications, past family history, past medical history, past social history, past surgical history and problem list.  Review of Systems Review of Systems  Constitutional: Negative for activity change, appetite change and fatigue.  HENT: Negative for hearing loss, congestion, tinnitus and ear discharge.  dentist q64m Eyes: Negative for visual disturbance (see optho q1y --) Respiratory: Negative for cough, chest tightness and shortness of breath.   Cardiovascular: Negative for chest pain, palpitations and leg swelling.  Gastrointestinal: Negative for abdominal pain, diarrhea, constipation and abdominal distention.  Genitourinary: Negative for urgency, frequency, decreased urine volume and difficulty urinating.  Musculoskeletal: Negative for back pain, arthralgias and gait problem.  Skin: Negative for color change, pallor and rash.  Neurological: Negative for dizziness, light-headedness, numbness and headaches.  Hematological: Negative for adenopathy. Does not bruise/bleed easily.  Psychiatric/Behavioral: Negative for suicidal  ideas, confusion, sleep disturbance, self-injury, dysphoric mood, decreased concentration and agitation.      Objective:    BP 132/80  Pulse 70  Temp(Src) 98.9 F (37.2 C) (Oral)  Wt 256 lb 3.2 oz (116.212 kg)  SpO2 97%  LMP 10/20/2010  General Appearance:    Alert, cooperative, no distress, appears stated age  Head:    Normocephalic, without obvious abnormality, atraumatic  Eyes:    PERRL, conjunctiva/corneas clear, EOM's intact, fundi    benign, both eyes  Ears:    Normal TM's and external ear canals, both ears  Nose:   Nares normal, septum midline, mucosa normal, no drainage    or sinus tenderness  Throat:   Lips, mucosa, and tongue normal; teeth and gums normal  Neck:   Supple, symmetrical, trachea midline, no adenopathy;    thyroid:  no enlargement/tenderness/nodules; no carotid   bruit or JVD  Back:     Symmetric, no curvature, ROM normal, no CVA tenderness  Lungs:     Clear to auscultation bilaterally, respirations unlabored  Chest Wall:    No tenderness or deformity   Heart:    Regular rate and rhythm, S1 and S2 normal, no murmur, rub   or gallop  Breast Exam:    No tenderness, masses, or nipple abnormality  Abdomen:     Soft, non-tender, bowel sounds active all four quadrants,    no masses, no organomegaly  Genitalia:    Normal female without lesion, discharge or tenderness  Rectal:    Normal tone, normal prostate, no masses or tenderness;   guaiac negative stool  Extremities:   Extremities normal,  atraumatic, no cyanosis or edema  Pulses:   2+ and symmetric all extremities  Skin:   Skin color, texture, turgor normal, no rashes or lesions  Lymph nodes:   Cervical, supraclavicular, and axillary nodes normal  Neurologic:   CNII-XII intact, normal strength, sensation and reflexes    throughout      Assessment:    Healthy female exam.  HTN  --stable, con't meds   Plan:  ghm utd Check fasting labs mammo done Pap done rto for flu shot-   See After Visit  Summary for Counseling Recommendations

## 2011-06-08 ENCOUNTER — Other Ambulatory Visit: Payer: Self-pay | Admitting: Family Medicine

## 2011-06-08 NOTE — Progress Notes (Signed)
ERROR order already order future

## 2011-06-09 ENCOUNTER — Encounter: Payer: Self-pay | Admitting: Family Medicine

## 2011-06-09 ENCOUNTER — Other Ambulatory Visit (INDEPENDENT_AMBULATORY_CARE_PROVIDER_SITE_OTHER): Payer: BC Managed Care – PPO

## 2011-06-09 DIAGNOSIS — Z0000 Encounter for general adult medical examination without abnormal findings: Secondary | ICD-10-CM

## 2011-06-09 LAB — CBC WITH DIFFERENTIAL/PLATELET
Basophils Relative: 0.6 % (ref 0.0–3.0)
Eosinophils Absolute: 0.1 10*3/uL (ref 0.0–0.7)
Eosinophils Relative: 2.4 % (ref 0.0–5.0)
Hemoglobin: 12 g/dL (ref 12.0–15.0)
Lymphocytes Relative: 28.1 % (ref 12.0–46.0)
MCHC: 31.5 g/dL (ref 30.0–36.0)
Neutro Abs: 3 10*3/uL (ref 1.4–7.7)
RBC: 5.76 Mil/uL — ABNORMAL HIGH (ref 3.87–5.11)

## 2011-06-09 LAB — LIPID PANEL
Cholesterol: 153 mg/dL (ref 0–200)
LDL Cholesterol: 90 mg/dL (ref 0–99)
VLDL: 17.6 mg/dL (ref 0.0–40.0)

## 2011-06-09 LAB — HEPATIC FUNCTION PANEL
Bilirubin, Direct: 0.2 mg/dL (ref 0.0–0.3)
Total Bilirubin: 1.4 mg/dL — ABNORMAL HIGH (ref 0.3–1.2)
Total Protein: 6.9 g/dL (ref 6.0–8.3)

## 2011-06-09 NOTE — Progress Notes (Signed)
Labs only

## 2011-06-23 ENCOUNTER — Other Ambulatory Visit: Payer: Self-pay | Admitting: Family Medicine

## 2011-06-23 DIAGNOSIS — I10 Essential (primary) hypertension: Secondary | ICD-10-CM

## 2011-06-23 MED ORDER — PERINDOPRIL ERBUMINE 4 MG PO TABS: 6.0000 mg | ORAL_TABLET | Freq: Every day | ORAL | Status: DC

## 2011-06-23 MED ORDER — TRIAMTERENE-HCTZ 37.5-25 MG PO TABS: 1.0000 | ORAL_TABLET | Freq: Every day | ORAL | Status: DC

## 2011-06-23 NOTE — Telephone Encounter (Signed)
Rx faxed.    KP 

## 2012-02-11 ENCOUNTER — Other Ambulatory Visit: Payer: Self-pay | Admitting: *Deleted

## 2012-02-11 DIAGNOSIS — I10 Essential (primary) hypertension: Secondary | ICD-10-CM

## 2012-02-11 MED ORDER — PERINDOPRIL ERBUMINE 4 MG PO TABS: 6.0000 mg | ORAL_TABLET | Freq: Every day | ORAL | Status: DC

## 2012-07-04 ENCOUNTER — Other Ambulatory Visit: Payer: Self-pay | Admitting: Family Medicine

## 2012-07-04 DIAGNOSIS — I10 Essential (primary) hypertension: Secondary | ICD-10-CM

## 2012-07-04 MED ORDER — TRIAMTERENE-HCTZ 37.5-25 MG PO TABS: 1.0000 | ORAL_TABLET | Freq: Every day | ORAL | Status: DC

## 2012-07-04 NOTE — Telephone Encounter (Signed)
refill MAXZIDE-25 37.5-25 MG Take 1 each (1 tablet total) by mouth daily.--NO QTY Listed--last ov 9.18.12--V70 NEXT CPE sch. for 1.17.14

## 2012-09-05 ENCOUNTER — Other Ambulatory Visit: Payer: Self-pay | Admitting: *Deleted

## 2012-09-05 DIAGNOSIS — I10 Essential (primary) hypertension: Secondary | ICD-10-CM

## 2012-09-05 MED ORDER — PERINDOPRIL ERBUMINE 4 MG PO TABS: 6.0000 mg | ORAL_TABLET | Freq: Every day | ORAL | Status: DC

## 2012-09-05 NOTE — Telephone Encounter (Signed)
Rx sent, Pt aware pending OV 10-01-11.

## 2012-09-30 ENCOUNTER — Ambulatory Visit (INDEPENDENT_AMBULATORY_CARE_PROVIDER_SITE_OTHER): Payer: BC Managed Care – PPO | Admitting: Family Medicine

## 2012-09-30 ENCOUNTER — Encounter: Payer: Self-pay | Admitting: Family Medicine

## 2012-09-30 ENCOUNTER — Other Ambulatory Visit (HOSPITAL_COMMUNITY)
Admission: RE | Admit: 2012-09-30 | Discharge: 2012-09-30 | Disposition: A | Discharge status: Home / Self Care | Payer: BC Managed Care – PPO | Source: Ambulatory Visit | Attending: Family Medicine | Admitting: Family Medicine

## 2012-09-30 VITALS — BP 140/80 | HR 85 | Temp 98.7°F | Ht 66.0 in | Wt 259.2 lb

## 2012-09-30 DIAGNOSIS — Z23 Encounter for immunization: Secondary | ICD-10-CM

## 2012-09-30 DIAGNOSIS — Z0000 Encounter for general adult medical examination without abnormal findings: Secondary | ICD-10-CM

## 2012-09-30 DIAGNOSIS — Z01419 Encounter for gynecological examination (general) (routine) without abnormal findings: Secondary | ICD-10-CM | POA: Insufficient documentation

## 2012-09-30 DIAGNOSIS — I10 Essential (primary) hypertension: Secondary | ICD-10-CM

## 2012-09-30 DIAGNOSIS — Z1151 Encounter for screening for human papillomavirus (HPV): Secondary | ICD-10-CM | POA: Insufficient documentation

## 2012-09-30 LAB — POCT URINALYSIS DIPSTICK
Bilirubin, UA: NEGATIVE
Blood, UA: NEGATIVE
Glucose, UA: NEGATIVE
Ketones, UA: NEGATIVE
Leukocytes, UA: NEGATIVE
Nitrite, UA: NEGATIVE

## 2012-09-30 LAB — HEPATIC FUNCTION PANEL
ALT: 18 U/L (ref 0–35)
Albumin: 4.2 g/dL (ref 3.5–5.2)
Bilirubin, Direct: 0.1 mg/dL (ref 0.0–0.3)
Total Protein: 7.2 g/dL (ref 6.0–8.3)

## 2012-09-30 LAB — BASIC METABOLIC PANEL
CO2: 30 mEq/L (ref 19–32)
Chloride: 101 mEq/L (ref 96–112)
Potassium: 3.5 mEq/L (ref 3.5–5.1)
Sodium: 138 mEq/L (ref 135–145)

## 2012-09-30 LAB — CBC WITH DIFFERENTIAL/PLATELET
Basophils Relative: 0.8 % (ref 0.0–3.0)
Eosinophils Relative: 2.2 % (ref 0.0–5.0)
HCT: 39.4 % (ref 36.0–46.0)
Hemoglobin: 12.4 g/dL (ref 12.0–15.0)
Lymphs Abs: 1.5 10*3/uL (ref 0.7–4.0)
MCHC: 31.5 g/dL (ref 30.0–36.0)
MCV: 65.8 fl — ABNORMAL LOW (ref 78.0–100.0)
Monocytes Absolute: 0.3 10*3/uL (ref 0.1–1.0)
Neutro Abs: 3.6 10*3/uL (ref 1.4–7.7)
Neutrophils Relative %: 64.3 % (ref 43.0–77.0)
RBC: 5.98 Mil/uL — ABNORMAL HIGH (ref 3.87–5.11)
WBC: 5.5 10*3/uL (ref 4.5–10.5)

## 2012-09-30 LAB — LIPID PANEL
Total CHOL/HDL Ratio: 4
Triglycerides: 67 mg/dL (ref 0.0–149.0)

## 2012-09-30 MED ORDER — PERINDOPRIL ERBUMINE 4 MG PO TABS: 6.0000 mg | ORAL_TABLET | Freq: Every day | ORAL | Status: DC

## 2012-09-30 MED ORDER — TRIAMTERENE-HCTZ 37.5-25 MG PO TABS: 1.0000 | ORAL_TABLET | Freq: Every day | ORAL | Status: DC

## 2012-09-30 NOTE — Assessment & Plan Note (Signed)
Stable con't meds 

## 2012-09-30 NOTE — Progress Notes (Signed)
Subjective:     Marissa Leblanc is a 57 y.o. female and is here for a comprehensive physical exam. The patient reports no problems.  History   Social History  . Marital Status: Divorced    Spouse Name: N/A    Number of Children: N/A  . Years of Education: N/A   Occupational History  . [employer]    Social History Main Topics  . Smoking status: Never Smoker   . Smokeless tobacco: Never Used  . Alcohol Use: No  . Drug Use: No  . Sexually Active: Not Currently -- Female partner(s)   Other Topics Concern  . Not on file   Social History Narrative   Exercise-- no---but she will start   Health Maintenance  Topic Date Due  . Influenza Vaccine  05/15/2012  . Mammogram  11/16/2012  . Pap Smear  10/01/2015  . Colonoscopy  06/01/2016  . Tetanus/tdap  07/12/2016    The following portions of the patient's history were reviewed and updated as appropriate:  She  has a past medical history of Hypertension. She  does not have any pertinent problems on file. She  has past surgical history that includes Tear duct probing. Her family history includes Angina in her maternal grandmother; Diabetes in an unspecified family member; Heart attack in her maternal grandmother; Hypertension in her father, mother, and sister; and Stroke in her father. She  reports that she has never smoked. She has never used smokeless tobacco. She reports that she does not drink alcohol or use illicit drugs. She has a current medication list which includes the following prescription(s): perindopril and triamterene-hydrochlorothiazide. Current Outpatient Prescriptions on File Prior to Visit  Medication Sig Dispense Refill  . perindopril (ACEON) 4 MG tablet Take 1.5 tablets (6 mg total) by mouth daily.  135 tablet  3  . triamterene-hydrochlorothiazide (MAXZIDE-25) 37.5-25 MG per tablet Take 1 each (1 tablet total) by mouth daily.  90 tablet  3   [name] allergic to penicillins..  Review of Systems Review of Systems    Constitutional: Negative for activity change, appetite change and fatigue.  HENT: Negative for hearing loss, congestion, tinnitus and ear discharge.  dentist q28m Eyes: Negative for visual disturbance (see optho q1y -- vision corrected to 20/20 with glasses).  Respiratory: Negative for cough, chest tightness and shortness of breath.   Cardiovascular: Negative for chest pain, palpitations and leg swelling.  Gastrointestinal: Negative for abdominal pain, diarrhea, constipation and abdominal distention.  Genitourinary: Negative for urgency, frequency, decreased urine volume and difficulty urinating.  Musculoskeletal: Negative for back pain, arthralgias and gait problem.  Skin: Negative for color change, pallor and rash.  Neurological: Negative for dizziness, light-headedness, numbness and headaches.  Hematological: Negative for adenopathy. Does not bruise/bleed easily.  Psychiatric/Behavioral: Negative for suicidal ideas, confusion, sleep disturbance, self-injury, dysphoric mood, decreased concentration and agitation.       Objective:    BP 140/80  Pulse 85  Temp 98.7 F (37.1 C) (Oral)  Ht 5\' 6"  (1.676 m)  Wt 259 lb 3.2 oz (117.572 kg)  BMI 41.84 kg/m2  SpO2 96%  LMP 10/20/2010 General appearance: alert, cooperative, appears stated age and no distress Head: Normocephalic, without obvious abnormality, atraumatic Eyes: conjunctivae/corneas clear. PERRL, EOM's intact. Fundi benign. Ears: normal TM's and external ear canals both ears Nose: Nares normal. Septum midline. Mucosa normal. No drainage or sinus tenderness. Throat: lips, mucosa, and tongue normal; teeth and gums normal Neck: no adenopathy, no carotid bruit, no JVD, supple, symmetrical,  trachea midline and thyroid not enlarged, symmetric, no tenderness/mass/nodules Back: symmetric, no curvature. ROM normal. No CVA tenderness. Lungs: clear to auscultation bilaterally Breasts: normal appearance, no masses or tenderness Heart:  regular rate and rhythm, S1, S2 normal, no murmur, click, rub or gallop Abdomen: soft, non-tender; bowel sounds normal; no masses,  no organomegaly Pelvic: cervix normal in appearance, external genitalia normal, no adnexal masses or tenderness, no cervical motion tenderness, rectovaginal septum normal, uterus normal size, shape, and consistency and vagina normal without discharge Extremities: extremities normal, atraumatic, no cyanosis or edema Pulses: 2+ and symmetric Skin: Skin color, texture, turgor normal. No rashes or lesions Lymph nodes: Cervical, supraclavicular, and axillary nodes normal. Neurologic: Alert and oriented X 3, normal strength and tone. Normal symmetric reflexes. Normal coordination and gait psych--no depression, no anxiety    Assessment:    Healthy female exam.       Plan:    ghm utd Check labs See After Visit Summary for Counseling Recommendations

## 2012-09-30 NOTE — Addendum Note (Signed)
Addended by: Arnette Norris on: 09/30/2012 12:00 PM   Modules accepted: Orders

## 2012-09-30 NOTE — Addendum Note (Signed)
Addended by: Arnette Norris on: 09/30/2012 12:02 PM   Modules accepted: Orders

## 2012-09-30 NOTE — Patient Instructions (Signed)
Preventive Care for Adults, Female A healthy lifestyle and preventive care can promote health and wellness. Preventive health guidelines for women include the following key practices.  A routine yearly physical is a good way to check with your caregiver about your health and preventive screening. It is a chance to share any concerns and updates on your health, and to receive a thorough exam.  Visit your dentist for a routine exam and preventive care every 6 months. Brush your teeth twice a day and floss once a day. Good oral hygiene prevents tooth decay and gum disease.  The frequency of eye exams is based on your age, health, family medical history, use of contact lenses, and other factors. Follow your caregiver's recommendations for frequency of eye exams.  Eat a healthy diet. Foods like vegetables, fruits, whole grains, low-fat dairy products, and lean protein foods contain the nutrients you need without too many calories. Decrease your intake of foods high in solid fats, added sugars, and salt. Eat the right amount of calories for you.Get information about a proper diet from your caregiver, if necessary.  Regular physical exercise is one of the most important things you can do for your health. Most adults should get at least 150 minutes of moderate-intensity exercise (any activity that increases your heart rate and causes you to sweat) each week. In addition, most adults need muscle-strengthening exercises on 2 or more days a week.  Maintain a healthy weight. The body mass index (BMI) is a screening tool to identify possible weight problems. It provides an estimate of body fat based on height and weight. Your caregiver can help determine your BMI, and can help you achieve or maintain a healthy weight.For adults 20 years and older:  A BMI below 18.5 is considered underweight.  A BMI of 18.5 to 24.9 is normal.  A BMI of 25 to 29.9 is considered overweight.  A BMI of 30 and above is  considered obese.  Maintain normal blood lipids and cholesterol levels by exercising and minimizing your intake of saturated fat. Eat a balanced diet with plenty of fruit and vegetables. Blood tests for lipids and cholesterol should begin at age 20 and be repeated every 5 years. If your lipid or cholesterol levels are high, you are over 50, or you are at high risk for heart disease, you may need your cholesterol levels checked more frequently.Ongoing high lipid and cholesterol levels should be treated with medicines if diet and exercise are not effective.  If you smoke, find out from your caregiver how to quit. If you do not use tobacco, do not start.  If you are pregnant, do not drink alcohol. If you are breastfeeding, be very cautious about drinking alcohol. If you are not pregnant and choose to drink alcohol, do not exceed 1 drink per day. One drink is considered to be 12 ounces (355 mL) of beer, 5 ounces (148 mL) of wine, or 1.5 ounces (44 mL) of liquor.  Avoid use of street drugs. Do not share needles with anyone. Ask for help if you need support or instructions about stopping the use of drugs.  High blood pressure causes heart disease and increases the risk of stroke. Your blood pressure should be checked at least every 1 to 2 years. Ongoing high blood pressure should be treated with medicines if weight loss and exercise are not effective.  If you are 55 to 57 years old, ask your caregiver if you should take aspirin to prevent strokes.  Diabetes   screening involves taking a blood sample to check your fasting blood sugar level. This should be done once every 3 years, after age 45, if you are within normal weight and without risk factors for diabetes. Testing should be considered at a younger age or be carried out more frequently if you are overweight and have at least 1 risk factor for diabetes.  Breast cancer screening is essential preventive care for women. You should practice "breast  self-awareness." This means understanding the normal appearance and feel of your breasts and may include breast self-examination. Any changes detected, no matter how small, should be reported to a caregiver. Women in their 20s and 30s should have a clinical breast exam (CBE) by a caregiver as part of a regular health exam every 1 to 3 years. After age 40, women should have a CBE every year. Starting at age 40, women should consider having a mammography (breast X-ray test) every year. Women who have a family history of breast cancer should talk to their caregiver about genetic screening. Women at a high risk of breast cancer should talk to their caregivers about having magnetic resonance imaging (MRI) and a mammography every year.  The Pap test is a screening test for cervical cancer. A Pap test can show cell changes on the cervix that might become cervical cancer if left untreated. A Pap test is a procedure in which cells are obtained and examined from the lower end of the uterus (cervix).  Women should have a Pap test starting at age 21.  Between ages 21 and 29, Pap tests should be repeated every 2 years.  Beginning at age 30, you should have a Pap test every 3 years as long as the past 3 Pap tests have been normal.  Some women have medical problems that increase the chance of getting cervical cancer. Talk to your caregiver about these problems. It is especially important to talk to your caregiver if a new problem develops soon after your last Pap test. In these cases, your caregiver may recommend more frequent screening and Pap tests.  The above recommendations are the same for women who have or have not gotten the vaccine for human papillomavirus (HPV).  If you had a hysterectomy for a problem that was not cancer or a condition that could lead to cancer, then you no longer need Pap tests. Even if you no longer need a Pap test, a regular exam is a good idea to make sure no other problems are  starting.  If you are between ages 65 and 70, and you have had normal Pap tests going back 10 years, you no longer need Pap tests. Even if you no longer need a Pap test, a regular exam is a good idea to make sure no other problems are starting.  If you have had past treatment for cervical cancer or a condition that could lead to cancer, you need Pap tests and screening for cancer for at least 20 years after your treatment.  If Pap tests have been discontinued, risk factors (such as a new sexual partner) need to be reassessed to determine if screening should be resumed.  The HPV test is an additional test that may be used for cervical cancer screening. The HPV test looks for the virus that can cause the cell changes on the cervix. The cells collected during the Pap test can be tested for HPV. The HPV test could be used to screen women aged 30 years and older, and should   be used in women of any age who have unclear Pap test results. After the age of 30, women should have HPV testing at the same frequency as a Pap test.  Colorectal cancer can be detected and often prevented. Most routine colorectal cancer screening begins at the age of 50 and continues through age 75. However, your caregiver may recommend screening at an earlier age if you have risk factors for colon cancer. On a yearly basis, your caregiver may provide home test kits to check for hidden blood in the stool. Use of a small camera at the end of a tube, to directly examine the colon (sigmoidoscopy or colonoscopy), can detect the earliest forms of colorectal cancer. Talk to your caregiver about this at age 50, when routine screening begins. Direct examination of the colon should be repeated every 5 to 10 years through age 75, unless early forms of pre-cancerous polyps or small growths are found.  Hepatitis C blood testing is recommended for all people born from 1945 through 1965 and any individual with known risks for hepatitis C.  Practice  safe sex. Use condoms and avoid high-risk sexual practices to reduce the spread of sexually transmitted infections (STIs). STIs include gonorrhea, chlamydia, syphilis, trichomonas, herpes, HPV, and human immunodeficiency virus (HIV). Herpes, HIV, and HPV are viral illnesses that have no cure. They can result in disability, cancer, and death. Sexually active women aged 25 and younger should be checked for chlamydia. Older women with new or multiple partners should also be tested for chlamydia. Testing for other STIs is recommended if you are sexually active and at increased risk.  Osteoporosis is a disease in which the bones lose minerals and strength with aging. This can result in serious bone fractures. The risk of osteoporosis can be identified using a bone density scan. Women ages 65 and over and women at risk for fractures or osteoporosis should discuss screening with their caregivers. Ask your caregiver whether you should take a calcium supplement or vitamin D to reduce the rate of osteoporosis.  Menopause can be associated with physical symptoms and risks. Hormone replacement therapy is available to decrease symptoms and risks. You should talk to your caregiver about whether hormone replacement therapy is right for you.  Use sunscreen with sun protection factor (SPF) of 30 or more. Apply sunscreen liberally and repeatedly throughout the day. You should seek shade when your shadow is shorter than you. Protect yourself by wearing long sleeves, pants, a wide-brimmed hat, and sunglasses year round, whenever you are outdoors.  Once a month, do a whole body skin exam, using a mirror to look at the skin on your back. Notify your caregiver of new moles, moles that have irregular borders, moles that are larger than a pencil eraser, or moles that have changed in shape or color.  Stay current with required immunizations.  Influenza. You need a dose every fall (or winter). The composition of the flu vaccine  changes each year, so being vaccinated once is not enough.  Pneumococcal polysaccharide. You need 1 to 2 doses if you smoke cigarettes or if you have certain chronic medical conditions. You need 1 dose at age 65 (or older) if you have never been vaccinated.  Tetanus, diphtheria, pertussis (Tdap, Td). Get 1 dose of Tdap vaccine if you are younger than age 65, are over 65 and have contact with an infant, are a healthcare worker, are pregnant, or simply want to be protected from whooping cough. After that, you need a Td   booster dose every 10 years. Consult your caregiver if you have not had at least 3 tetanus and diphtheria-containing shots sometime in your life or have a deep or dirty wound.  HPV. You need this vaccine if you are a woman age 26 or younger. The vaccine is given in 3 doses over 6 months.  Measles, mumps, rubella (MMR). You need at least 1 dose of MMR if you were born in 1957 or later. You may also need a second dose.  Meningococcal. If you are age 19 to 21 and a first-year college student living in a residence hall, or have one of several medical conditions, you need to get vaccinated against meningococcal disease. You may also need additional booster doses.  Zoster (shingles). If you are age 60 or older, you should get this vaccine.  Varicella (chickenpox). If you have never had chickenpox or you were vaccinated but received only 1 dose, talk to your caregiver to find out if you need this vaccine.  Hepatitis A. You need this vaccine if you have a specific risk factor for hepatitis A virus infection or you simply wish to be protected from this disease. The vaccine is usually given as 2 doses, 6 to 18 months apart.  Hepatitis B. You need this vaccine if you have a specific risk factor for hepatitis B virus infection or you simply wish to be protected from this disease. The vaccine is given in 3 doses, usually over 6 months. Preventive Services / Frequency Ages 19 to 39  Blood  pressure check.** / Every 1 to 2 years.  Lipid and cholesterol check.** / Every 5 years beginning at age 20.  Clinical breast exam.** / Every 3 years for women in their 20s and 30s.  Pap test.** / Every 2 years from ages 21 through 29. Every 3 years starting at age 30 through age 65 or 70 with a history of 3 consecutive normal Pap tests.  HPV screening.** / Every 3 years from ages 30 through ages 65 to 70 with a history of 3 consecutive normal Pap tests.  Hepatitis C blood test.** / For any individual with known risks for hepatitis C.  Skin self-exam. / Monthly.  Influenza immunization.** / Every year.  Pneumococcal polysaccharide immunization.** / 1 to 2 doses if you smoke cigarettes or if you have certain chronic medical conditions.  Tetanus, diphtheria, pertussis (Tdap, Td) immunization. / A one-time dose of Tdap vaccine. After that, you need a Td booster dose every 10 years.  HPV immunization. / 3 doses over 6 months, if you are 26 and younger.  Measles, mumps, rubella (MMR) immunization. / You need at least 1 dose of MMR if you were born in 1957 or later. You may also need a second dose.  Meningococcal immunization. / 1 dose if you are age 19 to 21 and a first-year college student living in a residence hall, or have one of several medical conditions, you need to get vaccinated against meningococcal disease. You may also need additional booster doses.  Varicella immunization.** / Consult your caregiver.  Hepatitis A immunization.** / Consult your caregiver. 2 doses, 6 to 18 months apart.  Hepatitis B immunization.** / Consult your caregiver. 3 doses usually over 6 months. Ages 40 to 64  Blood pressure check.** / Every 1 to 2 years.  Lipid and cholesterol check.** / Every 5 years beginning at age 20.  Clinical breast exam.** / Every year after age 40.  Mammogram.** / Every year beginning at age 40   and continuing for as long as you are in good health. Consult with your  caregiver.  Pap test.** / Every 3 years starting at age 30 through age 65 or 70 with a history of 3 consecutive normal Pap tests.  HPV screening.** / Every 3 years from ages 30 through ages 65 to 70 with a history of 3 consecutive normal Pap tests.  Fecal occult blood test (FOBT) of stool. / Every year beginning at age 50 and continuing until age 75. You may not need to do this test if you get a colonoscopy every 10 years.  Flexible sigmoidoscopy or colonoscopy.** / Every 5 years for a flexible sigmoidoscopy or every 10 years for a colonoscopy beginning at age 50 and continuing until age 75.  Hepatitis C blood test.** / For all people born from 1945 through 1965 and any individual with known risks for hepatitis C.  Skin self-exam. / Monthly.  Influenza immunization.** / Every year.  Pneumococcal polysaccharide immunization.** / 1 to 2 doses if you smoke cigarettes or if you have certain chronic medical conditions.  Tetanus, diphtheria, pertussis (Tdap, Td) immunization.** / A one-time dose of Tdap vaccine. After that, you need a Td booster dose every 10 years.  Measles, mumps, rubella (MMR) immunization. / You need at least 1 dose of MMR if you were born in 1957 or later. You may also need a second dose.  Varicella immunization.** / Consult your caregiver.  Meningococcal immunization.** / Consult your caregiver.  Hepatitis A immunization.** / Consult your caregiver. 2 doses, 6 to 18 months apart.  Hepatitis B immunization.** / Consult your caregiver. 3 doses, usually over 6 months. Ages 65 and over  Blood pressure check.** / Every 1 to 2 years.  Lipid and cholesterol check.** / Every 5 years beginning at age 20.  Clinical breast exam.** / Every year after age 40.  Mammogram.** / Every year beginning at age 40 and continuing for as long as you are in good health. Consult with your caregiver.  Pap test.** / Every 3 years starting at age 30 through age 65 or 70 with a 3  consecutive normal Pap tests. Testing can be stopped between 65 and 70 with 3 consecutive normal Pap tests and no abnormal Pap or HPV tests in the past 10 years.  HPV screening.** / Every 3 years from ages 30 through ages 65 or 70 with a history of 3 consecutive normal Pap tests. Testing can be stopped between 65 and 70 with 3 consecutive normal Pap tests and no abnormal Pap or HPV tests in the past 10 years.  Fecal occult blood test (FOBT) of stool. / Every year beginning at age 50 and continuing until age 75. You may not need to do this test if you get a colonoscopy every 10 years.  Flexible sigmoidoscopy or colonoscopy.** / Every 5 years for a flexible sigmoidoscopy or every 10 years for a colonoscopy beginning at age 50 and continuing until age 75.  Hepatitis C blood test.** / For all people born from 1945 through 1965 and any individual with known risks for hepatitis C.  Osteoporosis screening.** / A one-time screening for women ages 65 and over and women at risk for fractures or osteoporosis.  Skin self-exam. / Monthly.  Influenza immunization.** / Every year.  Pneumococcal polysaccharide immunization.** / 1 dose at age 65 (or older) if you have never been vaccinated.  Tetanus, diphtheria, pertussis (Tdap, Td) immunization. / A one-time dose of Tdap vaccine if you are over   65 and have contact with an infant, are a healthcare worker, or simply want to be protected from whooping cough. After that, you need a Td booster dose every 10 years.  Varicella immunization.** / Consult your caregiver.  Meningococcal immunization.** / Consult your caregiver.  Hepatitis A immunization.** / Consult your caregiver. 2 doses, 6 to 18 months apart.  Hepatitis B immunization.** / Check with your caregiver. 3 doses, usually over 6 months. ** Family history and personal history of risk and conditions may change your caregiver's recommendations. Document Released: 10/27/2001 Document Revised: 11/23/2011  Document Reviewed: 01/26/2011 [employer] Patient Information 2013 [employer].  

## 2012-10-04 ENCOUNTER — Other Ambulatory Visit (INDEPENDENT_AMBULATORY_CARE_PROVIDER_SITE_OTHER): Payer: BC Managed Care – PPO

## 2012-10-04 DIAGNOSIS — D7289 Other specified disorders of white blood cells: Secondary | ICD-10-CM

## 2012-10-04 LAB — CBC WITH DIFFERENTIAL/PLATELET
Basophils Absolute: 0 K/uL (ref 0.0–0.1)
Basophils Relative: 0.8 % (ref 0.0–3.0)
Eosinophils Absolute: 0.1 K/uL (ref 0.0–0.7)
Eosinophils Relative: 2.7 % (ref 0.0–5.0)
HCT: 37.8 % (ref 36.0–46.0)
Hemoglobin: 12.1 g/dL (ref 12.0–15.0)
Lymphocytes Relative: 30.9 % (ref 12.0–46.0)
Lymphs Abs: 1.6 K/uL (ref 0.7–4.0)
MCHC: 32.1 g/dL (ref 30.0–36.0)
MCV: 64.4 fl — ABNORMAL LOW (ref 78.0–100.0)
Monocytes Absolute: 0.2 K/uL (ref 0.1–1.0)
Monocytes Relative: 4.5 % (ref 3.0–12.0)
Neutro Abs: 3.2 K/uL (ref 1.4–7.7)
Neutrophils Relative %: 61.1 % (ref 43.0–77.0)
Platelets: 267 K/uL (ref 150.0–400.0)
RBC: 5.88 Mil/uL — ABNORMAL HIGH (ref 3.87–5.11)
RDW: 15.3 % — ABNORMAL HIGH (ref 11.5–14.6)
WBC: 5.2 K/uL (ref 4.5–10.5)

## 2012-10-05 ENCOUNTER — Ambulatory Visit: Payer: BC Managed Care – PPO

## 2012-10-05 DIAGNOSIS — R7989 Other specified abnormal findings of blood chemistry: Secondary | ICD-10-CM

## 2013-05-10 ENCOUNTER — Encounter: Payer: Self-pay | Admitting: Family Medicine

## 2013-05-10 ENCOUNTER — Ambulatory Visit (INDEPENDENT_AMBULATORY_CARE_PROVIDER_SITE_OTHER): Payer: BC Managed Care – PPO | Admitting: Family Medicine

## 2013-05-10 VITALS — BP 120/72 | HR 66 | Temp 98.5°F | Wt 260.0 lb

## 2013-05-10 DIAGNOSIS — S43499A Other sprain of unspecified shoulder joint, initial encounter: Secondary | ICD-10-CM

## 2013-05-10 DIAGNOSIS — E669 Obesity, unspecified: Secondary | ICD-10-CM | POA: Insufficient documentation

## 2013-05-10 DIAGNOSIS — S46812A Strain of other muscles, fascia and tendons at shoulder and upper arm level, left arm, initial encounter: Secondary | ICD-10-CM

## 2013-05-10 MED ORDER — CYCLOBENZAPRINE HCL 10 MG PO TABS: 10.0000 mg | ORAL_TABLET | Freq: Three times a day (TID) | ORAL | Status: DC | PRN

## 2013-05-10 NOTE — Patient Instructions (Addendum)
Shoulder Sprain  A shoulder sprain is the result of damage to the tough, fiber-like tissues (ligaments) that help hold your shoulder in place. The ligaments may be stretched or torn. Besides the main shoulder joint (the ball and socket), there are several smaller joints that connect the bones in this area. A sprain usually involves one of those joints. Most often it is the acromioclavicular (or AC) joint. That is the joint that connects the collarbone (clavicle) and the shoulder blade (scapula) at the top point of the shoulder blade (acromion).  A shoulder sprain is a mild form of what is called a shoulder separation. Recovering from a shoulder sprain may take some time. For some, pain lingers for several months. Most people recover without long term problems.  CAUSES    A shoulder sprain is usually caused by some kind of trauma. This might be:   Falling on an outstretched arm.   Being hit hard on the shoulder.   Twisting the arm.   Shoulder sprains are more likely to occur in people who:   Play sports.   Have balance or coordination problems.  SYMPTOMS    Pain when you move your shoulder.   Limited ability to move the shoulder.   Swelling and tenderness on top of the shoulder.   Redness or warmth in the shoulder.   Bruising.   A change in the shape of the shoulder.  DIAGNOSIS   Your healthcare provider may:   Ask about your symptoms.   Ask about recent activity that might have caused those symptoms.   Examine your shoulder. You may be asked to do simple exercises to test movement. The other shoulder will be examined for comparison.   Order some tests that provide a look inside the body. They can show the extent of the injury. The tests could include:   X-rays.   CT (computed tomography) scan.   MRI (magnetic resonance imaging) scan.  RISKS AND COMPLICATIONS   Loss of full shoulder motion.   Ongoing shoulder pain.  TREATMENT   How long it takes to recover from a shoulder sprain depends on how  severe it was. Treatment options may include:   Rest. You should not use the arm or shoulder until it heals.   Ice. For 2 or 3 days after the injury, put an ice pack on the shoulder up to 4 times a day. It should stay on for 15 to 20 minutes each time. Wrap the ice in a towel so it does not touch your skin.   Over-the-counter medicine to relieve pain.   A sling or brace. This will keep the arm still while the shoulder is healing.   Physical therapy or rehabilitation exercises. These will help you regain strength and motion. Ask your healthcare provider when it is OK to begin these exercises.   Surgery. The need for surgery is rare with a sprained shoulder, but some people may need surgery to keep the joint in place and reduce pain.  HOME CARE INSTRUCTIONS    Ask your healthcare provider about what you should and should not do while your shoulder heals.   Make sure you know how to apply ice to the correct area of your shoulder.   Talk with your healthcare provider about which medications should be used for pain and swelling.   If rehabilitation therapy will be needed, ask your healthcare provider to refer you to a therapist. If it is not recommended, then ask about at-home exercises. Find   out when exercise should begin.  SEEK MEDICAL CARE IF:   Your pain, swelling, or redness at the joint increases.  SEEK IMMEDIATE MEDICAL CARE IF:    You have a fever.   You cannot move your arm or shoulder.  Document Released: 01/17/2009 Document Revised: 11/23/2011 Document Reviewed: 01/17/2009  [employer] Patient Information 2014 [employer].

## 2013-05-10 NOTE — Progress Notes (Signed)
  Subjective:    Marissa Leblanc is a 57 y.o. female who presents with left shoulder pain. The symptoms began several weeks ago. Aggravating factors: no known event. Pain is located diffusely throughout the shoulder and shoulder blade. Discomfort is described as burning and numbness. Symptoms are exacerbated by overhead movements. Evaluation to date: none. Therapy to date includes: nothing specific.  The following portions of the patient's history were reviewed and updated as appropriate: allergies, current medications, past family history, past medical history, past social history, past surgical history and problem list.  Review of Systems Pertinent items are noted in HPI.   Objective:    BP 120/72  Pulse 66  Temp(Src) 98.5 F (36.9 C) (Oral)  Wt 260 lb (117.935 kg)  BMI 41.99 kg/m2  SpO2 97%  LMP 10/20/2010 Right shoulder: normal active ROM, no tenderness, no impingement sign  Left shoulder: sensory exam normal, motor exam normal, radial pulse intact and pain with overhead movement, + pain around shoulder blade     Assessment:    Left trapezius muscle strain    Plan:    Agricultural engineer distributed. Reduction in offending activity. Gentle ROM exercises. Rest, ice, compression, and elevation (RICE) therapy. NSAIDs per medication orders. muscle relaxer

## 2013-07-20 ENCOUNTER — Other Ambulatory Visit: Payer: Self-pay

## 2013-09-15 ENCOUNTER — Other Ambulatory Visit: Payer: Self-pay

## 2013-09-15 DIAGNOSIS — I10 Essential (primary) hypertension: Secondary | ICD-10-CM

## 2013-09-15 MED ORDER — TRIAMTERENE-HCTZ 37.5-25 MG PO TABS: 1.0000 | ORAL_TABLET | Freq: Every day | ORAL | Status: DC

## 2013-09-15 MED ORDER — PERINDOPRIL ERBUMINE 4 MG PO TABS: 6.0000 mg | ORAL_TABLET | Freq: Every day | ORAL | Status: DC

## 2013-12-14 ENCOUNTER — Other Ambulatory Visit: Payer: Self-pay

## 2013-12-14 DIAGNOSIS — I10 Essential (primary) hypertension: Secondary | ICD-10-CM

## 2013-12-14 MED ORDER — PERINDOPRIL ERBUMINE 4 MG PO TABS: 6.0000 mg | ORAL_TABLET | Freq: Every day | ORAL | Status: DC

## 2013-12-14 MED ORDER — TRIAMTERENE-HCTZ 37.5-25 MG PO TABS: 1.0000 | ORAL_TABLET | Freq: Every day | ORAL | Status: DC

## 2014-01-25 ENCOUNTER — Other Ambulatory Visit: Payer: Self-pay

## 2014-01-25 DIAGNOSIS — Z1231 Encounter for screening mammogram for malignant neoplasm of breast: Secondary | ICD-10-CM

## 2014-01-29 ENCOUNTER — Ambulatory Visit
Admission: RE | Admit: 2014-01-29 | Discharge: 2014-01-29 | Disposition: A | Discharge status: Home / Self Care | Payer: BC Managed Care – PPO | Source: Ambulatory Visit

## 2014-01-29 DIAGNOSIS — Z1231 Encounter for screening mammogram for malignant neoplasm of breast: Secondary | ICD-10-CM

## 2014-02-06 ENCOUNTER — Telehealth: Payer: Self-pay

## 2014-02-06 NOTE — Telephone Encounter (Signed)
Left message for call back.    Pap- 09/30/12- + ASCUS-negative HPV  [facility]- 06/01/06  MMG- 01/30/14- negative Td- 07/12/06

## 2014-02-07 ENCOUNTER — Other Ambulatory Visit (HOSPITAL_COMMUNITY)
Admission: RE | Admit: 2014-02-07 | Discharge: 2014-02-07 | Disposition: A | Discharge status: Home / Self Care | Payer: BC Managed Care – PPO | Source: Ambulatory Visit | Attending: Family Medicine | Admitting: Family Medicine

## 2014-02-07 ENCOUNTER — Encounter: Payer: Self-pay | Admitting: Family Medicine

## 2014-02-07 ENCOUNTER — Ambulatory Visit (INDEPENDENT_AMBULATORY_CARE_PROVIDER_SITE_OTHER): Payer: BC Managed Care – PPO | Admitting: Family Medicine

## 2014-02-07 VITALS — BP 110/64 | HR 79 | Temp 98.1°F | Ht 66.0 in | Wt 252.0 lb

## 2014-02-07 DIAGNOSIS — Z1151 Encounter for screening for human papillomavirus (HPV): Secondary | ICD-10-CM | POA: Insufficient documentation

## 2014-02-07 DIAGNOSIS — Z124 Encounter for screening for malignant neoplasm of cervix: Secondary | ICD-10-CM

## 2014-02-07 DIAGNOSIS — I10 Essential (primary) hypertension: Secondary | ICD-10-CM

## 2014-02-07 DIAGNOSIS — Z0000 Encounter for general adult medical examination without abnormal findings: Secondary | ICD-10-CM

## 2014-02-07 LAB — HEPATIC FUNCTION PANEL
ALK PHOS: 49 U/L (ref 39–117)
ALT: 17 U/L (ref 0–35)
AST: 16 U/L (ref 0–37)
Albumin: 4.2 g/dL (ref 3.5–5.2)
BILIRUBIN DIRECT: 0.2 mg/dL (ref 0.0–0.3)
Total Bilirubin: 1.6 mg/dL — ABNORMAL HIGH (ref 0.2–1.2)
Total Protein: 7.1 g/dL (ref 6.0–8.3)

## 2014-02-07 LAB — LIPID PANEL
CHOL/HDL RATIO: 4
Cholesterol: 173 mg/dL (ref 0–200)
HDL: 45.7 mg/dL (ref >39.00)
LDL CALC: 110 mg/dL — AB (ref 0–99)
Triglycerides: 87 mg/dL (ref 0.0–149.0)
VLDL: 17.4 mg/dL (ref 0.0–40.0)

## 2014-02-07 LAB — POCT URINALYSIS DIPSTICK
BILIRUBIN UA: NEGATIVE
Blood, UA: NEGATIVE
GLUCOSE UA: NEGATIVE
Ketones, UA: NEGATIVE
LEUKOCYTES UA: NEGATIVE
NITRITE UA: NEGATIVE
PH UA: 8.5
Protein, UA: NEGATIVE
Spec Grav, UA: <=1.005
UROBILINOGEN UA: 0.2

## 2014-02-07 LAB — CBC WITH DIFFERENTIAL/PLATELET
Basophils Absolute: 0 10*3/uL (ref 0.0–0.1)
Basophils Relative: 0.7 % (ref 0.0–3.0)
EOS PCT: 2.7 % (ref 0.0–5.0)
Eosinophils Absolute: 0.1 10*3/uL (ref 0.0–0.7)
HCT: 40.5 % (ref 36.0–46.0)
Hemoglobin: 12.8 g/dL (ref 12.0–15.0)
LYMPHS PCT: 28.4 % (ref 12.0–46.0)
Lymphs Abs: 1.5 10*3/uL (ref 0.7–4.0)
MCHC: 31.6 g/dL (ref 30.0–36.0)
MCV: 66.3 fl — ABNORMAL LOW (ref 78.0–100.0)
MONOS PCT: 4.4 % (ref 3.0–12.0)
Monocytes Absolute: 0.2 10*3/uL (ref 0.1–1.0)
NEUTROS PCT: 63.8 % (ref 43.0–77.0)
Neutro Abs: 3.4 10*3/uL (ref 1.4–7.7)
Platelets: 321 10*3/uL (ref 150.0–400.0)
RBC: 6.12 Mil/uL — AB (ref 3.87–5.11)
RDW: 15.2 % (ref 11.5–15.5)
WBC: 5.3 10*3/uL (ref 4.0–10.5)

## 2014-02-07 LAB — BASIC METABOLIC PANEL
BUN: 12 mg/dL (ref 6–23)
CALCIUM: 9.2 mg/dL (ref 8.4–10.5)
CHLORIDE: 101 meq/L (ref 96–112)
CO2: 29 mEq/L (ref 19–32)
CREATININE: 0.9 mg/dL (ref 0.4–1.2)
GFR: 66.71 mL/min (ref >60.00)
Glucose, Bld: 131 mg/dL — ABNORMAL HIGH (ref 70–99)
Potassium: 3.2 mEq/L — ABNORMAL LOW (ref 3.5–5.1)
Sodium: 138 mEq/L (ref 135–145)

## 2014-02-07 LAB — TSH: TSH: 2.46 u[IU]/mL (ref 0.35–4.50)

## 2014-02-07 MED ORDER — PERINDOPRIL ERBUMINE 4 MG PO TABS: 6.0000 mg | ORAL_TABLET | Freq: Every day | ORAL | Status: DC

## 2014-02-07 MED ORDER — TRIAMTERENE-HCTZ 37.5-25 MG PO TABS: 1.0000 | ORAL_TABLET | Freq: Every day | ORAL | Status: DC

## 2014-02-07 NOTE — Patient Instructions (Signed)
Preventive Care for Adults, Female A healthy lifestyle and preventive care can promote health and wellness. Preventive health guidelines for women include the following key practices.  A routine yearly physical is a good way to check with your health care provider about your health and preventive screening. It is a chance to share any concerns and updates on your health and to receive a thorough exam.  Visit your dentist for a routine exam and preventive care every 6 months. Brush your teeth twice a day and floss once a day. Good oral hygiene prevents tooth decay and gum disease.  The frequency of eye exams is based on your age, health, family medical history, use of contact lenses, and other factors. Follow your health care provider's recommendations for frequency of eye exams.  Eat a healthy diet. Foods like vegetables, fruits, whole grains, low-fat dairy products, and lean protein foods contain the nutrients you need without too many calories. Decrease your intake of foods high in solid fats, added sugars, and salt. Eat the right amount of calories for you.Get information about a proper diet from your health care provider, if necessary.  Regular physical exercise is one of the most important things you can do for your health. Most adults should get at least 150 minutes of moderate-intensity exercise (any activity that increases your heart rate and causes you to sweat) each week. In addition, most adults need muscle-strengthening exercises on 2 or more days a week.  Maintain a healthy weight. The body mass index (BMI) is a screening tool to identify possible weight problems. It provides an estimate of body fat based on height and weight. Your health care provider can find your BMI, and can help you achieve or maintain a healthy weight.For adults 20 years and older:  A BMI below 18.5 is considered underweight.  A BMI of 18.5 to 24.9 is normal.  A BMI of 25 to 29.9 is considered overweight.  A  BMI of 30 and above is considered obese.  Maintain normal blood lipids and cholesterol levels by exercising and minimizing your intake of saturated fat. Eat a balanced diet with plenty of fruit and vegetables. Blood tests for lipids and cholesterol should begin at age 62 and be repeated every 5 years. If your lipid or cholesterol levels are high, you are over 50, or you are at high risk for heart disease, you may need your cholesterol levels checked more frequently.Ongoing high lipid and cholesterol levels should be treated with medicines if diet and exercise are not working.  If you smoke, find out from your health care provider how to quit. If you do not use tobacco, do not start.  Lung cancer screening is recommended for adults aged 36 80 years who are at high risk for developing lung cancer because of a history of smoking. A yearly low-dose CT scan of the lungs is recommended for people who have at least a 30-pack-year history of smoking and are a current smoker or have quit within the past 15 years. A pack year of smoking is smoking an average of 1 pack of cigarettes a day for 1 year (for example: 1 pack a day for 30 years or 2 packs a day for 15 years). Yearly screening should continue until the smoker has stopped smoking for at least 15 years. Yearly screening should be stopped for people who develop a health problem that would prevent them from having lung cancer treatment.  If you are pregnant, do not drink alcohol. If you  are breastfeeding, be very cautious about drinking alcohol. If you are not pregnant and choose to drink alcohol, do not have more than 1 drink per day. One drink is considered to be 12 ounces (355 mL) of beer, 5 ounces (148 mL) of wine, or 1.5 ounces (44 mL) of liquor.  Avoid use of street drugs. Do not share needles with anyone. Ask for help if you need support or instructions about stopping the use of drugs.  High blood pressure causes heart disease and increases the risk  of stroke. Your blood pressure should be checked at least every 1 to 2 years. Ongoing high blood pressure should be treated with medicines if weight loss and exercise do not work.  If you are 39 58 years old, ask your health care provider if you should take aspirin to prevent strokes.  Diabetes screening involves taking a blood sample to check your fasting blood sugar level. This should be done once every 3 years, after age 56, if you are within normal weight and without risk factors for diabetes. Testing should be considered at a younger age or be carried out more frequently if you are overweight and have at least 1 risk factor for diabetes.  Breast cancer screening is essential preventive care for women. You should practice "breast self-awareness." This means understanding the normal appearance and feel of your breasts and may include breast self-examination. Any changes detected, no matter how small, should be reported to a health care provider. Women in their 40s and 30s should have a clinical breast exam (CBE) by a health care provider as part of a regular health exam every 1 to 3 years. After age 28, women should have a CBE every year. Starting at age 72, women should consider having a mammogram (breast X-ray test) every year. Women who have a family history of breast cancer should talk to their health care provider about genetic screening. Women at a high risk of breast cancer should talk to their health care providers about having an MRI and a mammogram every year.  Breast cancer gene (BRCA)-related cancer risk assessment is recommended for women who have family members with BRCA-related cancers. BRCA-related cancers include breast, ovarian, tubal, and peritoneal cancers. Having family members with these cancers may be associated with an increased risk for harmful changes (mutations) in the breast cancer genes BRCA1 and BRCA2. Results of the assessment will determine the need for genetic counseling  and BRCA1 and BRCA2 testing.  The Pap test is a screening test for cervical cancer. A Pap test can show cell changes on the cervix that might become cervical cancer if left untreated. A Pap test is a procedure in which cells are obtained and examined from the lower end of the uterus (cervix).  Women should have a Pap test starting at age 59.  Between ages 42 and 13, Pap tests should be repeated every 2 years.  Beginning at age 53, you should have a Pap test every 3 years as long as the past 3 Pap tests have been normal.  Some women have medical problems that increase the chance of getting cervical cancer. Talk to your health care provider about these problems. It is especially important to talk to your health care provider if a new problem develops soon after your last Pap test. In these cases, your health care provider may recommend more frequent screening and Pap tests.  The above recommendations are the same for women who have or have not gotten the vaccine  for human papillomavirus (HPV).  If you had a hysterectomy for a problem that was not cancer or a condition that could lead to cancer, then you no longer need Pap tests. Even if you no longer need a Pap test, a regular exam is a good idea to make sure no other problems are starting.  If you are between ages 37 and 52 years, and you have had normal Pap tests going back 10 years, you no longer need Pap tests. Even if you no longer need a Pap test, a regular exam is a good idea to make sure no other problems are starting.  If you have had past treatment for cervical cancer or a condition that could lead to cancer, you need Pap tests and screening for cancer for at least 20 years after your treatment.  If Pap tests have been discontinued, risk factors (such as a new sexual partner) need to be reassessed to determine if screening should be resumed.  The HPV test is an additional test that may be used for cervical cancer screening. The HPV test  looks for the virus that can cause the cell changes on the cervix. The cells collected during the Pap test can be tested for HPV. The HPV test could be used to screen women aged 57 years and older, and should be used in women of any age who have unclear Pap test results. After the age of 10, women should have HPV testing at the same frequency as a Pap test.  Colorectal cancer can be detected and often prevented. Most routine colorectal cancer screening begins at the age of 76 years and continues through age 87 years. However, your health care provider may recommend screening at an earlier age if you have risk factors for colon cancer. On a yearly basis, your health care provider may provide home test kits to check for hidden blood in the stool. Use of a small camera at the end of a tube, to directly examine the colon (sigmoidoscopy or colonoscopy), can detect the earliest forms of colorectal cancer. Talk to your health care provider about this at age 56, when routine screening begins. Direct exam of the colon should be repeated every 5 10 years through age 39 years, unless early forms of pre-cancerous polyps or small growths are found.  People who are at an increased risk for hepatitis B should be screened for this virus. You are considered at high risk for hepatitis B if:  You were born in a country where hepatitis B occurs often. Talk with your health care provider about which countries are considered high risk.  Your parents were born in a high-risk country and you have not received a shot to protect against hepatitis B (hepatitis B vaccine).  You have HIV or AIDS.  You use needles to inject street drugs.  You live with, or have sex with, someone who has Hepatitis B.  You get hemodialysis treatment.  You take certain medicines for conditions like cancer, organ transplantation, and autoimmune conditions.  Hepatitis C blood testing is recommended for all people born from [birth year] through 1965 and  any individual with known risks for hepatitis C.  Practice safe sex. Use condoms and avoid high-risk sexual practices to reduce the spread of sexually transmitted infections (STIs). STIs include gonorrhea, chlamydia, syphilis, trichomonas, herpes, HPV, and human immunodeficiency virus (HIV). Herpes, HIV, and HPV are viral illnesses that have no cure. They can result in disability, cancer, and death. Sexually active women aged 25  years and younger should be checked for chlamydia. Older women with new or multiple partners should also be tested for chlamydia. Testing for other STIs is recommended if you are sexually active and at increased risk.  Osteoporosis is a disease in which the bones lose minerals and strength with aging. This can result in serious bone fractures or breaks. The risk of osteoporosis can be identified using a bone density scan. Women ages 69 years and over and women at risk for fractures or osteoporosis should discuss screening with their health care providers. Ask your health care provider whether you should take a calcium supplement or vitamin D to reduce the rate of osteoporosis.  Menopause can be associated with physical symptoms and risks. Hormone replacement therapy is available to decrease symptoms and risks. You should talk to your health care provider about whether hormone replacement therapy is right for you.  Use sunscreen. Apply sunscreen liberally and repeatedly throughout the day. You should seek shade when your shadow is shorter than you. Protect yourself by wearing long sleeves, pants, a wide-brimmed hat, and sunglasses year round, whenever you are outdoors.  Once a month, do a whole body skin exam, using a mirror to look at the skin on your back. Tell your health care provider of new moles, moles that have irregular borders, moles that are larger than a pencil eraser, or moles that have changed in shape or color.  Stay current with required vaccines  (immunizations).  Influenza vaccine. All adults should be immunized every year.  Tetanus, diphtheria, and acellular pertussis (Td, Tdap) vaccine. Pregnant women should receive 1 dose of Tdap vaccine during each pregnancy. The dose should be obtained regardless of the length of time since the last dose. Immunization is preferred during the 27th 36th week of gestation. An adult who has not previously received Tdap or who does not know her vaccine status should receive 1 dose of Tdap. This initial dose should be followed by tetanus and diphtheria toxoids (Td) booster doses every 10 years. Adults with an unknown or incomplete history of completing a 3-dose immunization series with Td-containing vaccines should begin or complete a primary immunization series including a Tdap dose. Adults should receive a Td booster every 10 years.  Varicella vaccine. An adult without evidence of immunity to varicella should receive 2 doses or a second dose if she has previously received 1 dose. Pregnant females who do not have evidence of immunity should receive the first dose after pregnancy. This first dose should be obtained before leaving the health care facility. The second dose should be obtained 4 8 weeks after the first dose.  Human papillomavirus (HPV) vaccine. Females aged 36 26 years who have not received the vaccine previously should obtain the 3-dose series. The vaccine is not recommended for use in pregnant females. However, pregnancy testing is not needed before receiving a dose. If a female is found to be pregnant after receiving a dose, no treatment is needed. In that case, the remaining doses should be delayed until after the pregnancy. Immunization is recommended for any person with an immunocompromised condition through the age of 54 years if she did not get any or all doses earlier. During the 3-dose series, the second dose should be obtained 4 8 weeks after the first dose. The third dose should be obtained  24 weeks after the first dose and 16 weeks after the second dose.  Zoster vaccine. One dose is recommended for adults aged 27 years or older unless certain  conditions are present.  Measles, mumps, and rubella (MMR) vaccine. Adults born before [birth year] generally are considered immune to measles and mumps. Adults born in [birth year] or later should have 1 or more doses of MMR vaccine unless there is a contraindication to the vaccine or there is laboratory evidence of immunity to each of the three diseases. A routine second dose of MMR vaccine should be obtained at least 28 days after the first dose for students attending postsecondary schools, health care workers, or international travelers. People who received inactivated measles vaccine or an unknown type of measles vaccine during 1963 1967 should receive 2 doses of MMR vaccine. People who received inactivated mumps vaccine or an unknown type of mumps vaccine before 1979 and are at high risk for mumps infection should consider immunization with 2 doses of MMR vaccine. For females of childbearing age, rubella immunity should be determined. If there is no evidence of immunity, females who are not pregnant should be vaccinated. If there is no evidence of immunity, females who are pregnant should delay immunization until after pregnancy. Unvaccinated health care workers born before [birth year] who lack laboratory evidence of measles, mumps, or rubella immunity or laboratory confirmation of disease should consider measles and mumps immunization with 2 doses of MMR vaccine or rubella immunization with 1 dose of MMR vaccine.  Pneumococcal 13-valent conjugate (PCV13) vaccine. When indicated, a person who is uncertain of her immunization history and has no record of immunization should receive the PCV13 vaccine. An adult aged 42 years or older who has certain medical conditions and has not been previously immunized should receive 1 dose of PCV13 vaccine. This PCV13 should be followed  with a dose of pneumococcal polysaccharide (PPSV23) vaccine. The PPSV23 vaccine dose should be obtained at least 8 weeks after the dose of PCV13 vaccine. An adult aged 4 years or older who has certain medical conditions and previously received 1 or more doses of PPSV23 vaccine should receive 1 dose of PCV13. The PCV13 vaccine dose should be obtained 1 or more years after the last PPSV23 vaccine dose.  Pneumococcal polysaccharide (PPSV23) vaccine. When PCV13 is also indicated, PCV13 should be obtained first. All adults aged 27 years and older should be immunized. An adult younger than age 33 years who has certain medical conditions should be immunized. Any person who resides in a nursing home or long-term care facility should be immunized. An adult smoker should be immunized. People with an immunocompromised condition and certain other conditions should receive both PCV13 and PPSV23 vaccines. People with human immunodeficiency virus (HIV) infection should be immunized as soon as possible after diagnosis. Immunization during chemotherapy or radiation therapy should be avoided. Routine use of PPSV23 vaccine is not recommended for American Indians, [place] Natives, or people younger than 65 years unless there are medical conditions that require PPSV23 vaccine. When indicated, people who have unknown immunization and have no record of immunization should receive PPSV23 vaccine. One-time revaccination 5 years after the first dose of PPSV23 is recommended for people aged 13 64 years who have chronic kidney failure, nephrotic syndrome, asplenia, or immunocompromised conditions. People who received 1 2 doses of PPSV23 before age 66 years should receive another dose of PPSV23 vaccine at age 27 years or later if at least 5 years have passed since the previous dose. Doses of PPSV23 are not needed for people immunized with PPSV23 at or after age 33 years.  Meningococcal vaccine. Adults with asplenia or persistent complement  component deficiencies should receive 2  doses of quadrivalent meningococcal conjugate (MenACWY-D) vaccine. The doses should be obtained at least 2 months apart. Microbiologists working with certain meningococcal bacteria, [employer] recruits, people at risk during an outbreak, and people who travel to or live in countries with a high rate of meningitis should be immunized. A first-year college student up through age 15 years who is living in a residence hall should receive a dose if she did not receive a dose on or after her 16th birthday. Adults who have certain high-risk conditions should receive one or more doses of vaccine.  Hepatitis A vaccine. Adults who wish to be protected from this disease, have certain high-risk conditions, work with hepatitis A-infected animals, work in hepatitis A research labs, or travel to or work in countries with a high rate of hepatitis A should be immunized. Adults who were previously unvaccinated and who anticipate close contact with an international adoptee during the first 60 days after arrival in the [place] from a country with a high rate of hepatitis A should be immunized.  Hepatitis B vaccine. Adults who wish to be protected from this disease, have certain high-risk conditions, may be exposed to blood or other infectious body fluids, are household contacts or sex partners of hepatitis B positive people, are clients or workers in certain care facilities, or travel to or work in countries with a high rate of hepatitis B should be immunized.  Haemophilus influenzae type b (Hib) vaccine. A previously unvaccinated person with asplenia or sickle cell disease or having a scheduled splenectomy should receive 1 dose of Hib vaccine. Regardless of previous immunization, a recipient of a hematopoietic stem cell transplant should receive a 3-dose series 6 12 months after her successful transplant. Hib vaccine is not recommended for adults with HIV infection. Preventive  Services / Frequency Ages 54 to 39years  Blood pressure check.** / Every 1 to 2 years.  Lipid and cholesterol check.** / Every 5 years beginning at age 81.  Clinical breast exam.** / Every 3 years for women in their 73s and 12s.  BRCA-related cancer risk assessment.** / For women who have family members with a BRCA-related cancer (breast, ovarian, tubal, or peritoneal cancers).  Pap test.** / Every 2 years from ages 62 through 32. Every 3 years starting at age 50 through age 62 or 73 with a history of 3 consecutive normal Pap tests.  HPV screening.** / Every 3 years from ages 57 through ages 63 to 11 with a history of 3 consecutive normal Pap tests.  Hepatitis C blood test.** / For any individual with known risks for hepatitis C.  Skin self-exam. / Monthly.  Influenza vaccine. / Every year.  Tetanus, diphtheria, and acellular pertussis (Tdap, Td) vaccine.** / Consult your health care provider. Pregnant women should receive 1 dose of Tdap vaccine during each pregnancy. 1 dose of Td every 10 years.  Varicella vaccine.** / Consult your health care provider. Pregnant females who do not have evidence of immunity should receive the first dose after pregnancy.  HPV vaccine. / 3 doses over 6 months, if 61 and younger. The vaccine is not recommended for use in pregnant females. However, pregnancy testing is not needed before receiving a dose.  Measles, mumps, rubella (MMR) vaccine.** / You need at least 1 dose of MMR if you were born in 1957 or later. You may also need a 2nd dose. For females of childbearing age, rubella immunity should be determined. If there is no evidence of immunity, females who are not  pregnant should be vaccinated. If there is no evidence of immunity, females who are pregnant should delay immunization until after pregnancy.  Pneumococcal 13-valent conjugate (PCV13) vaccine.** / Consult your health care provider.  Pneumococcal polysaccharide (PPSV23) vaccine.** / 1 to 2  doses if you smoke cigarettes or if you have certain conditions.  Meningococcal vaccine.** / 1 dose if you are age 88 to 6 years and a Market researcher living in a residence hall, or have one of several medical conditions, you need to get vaccinated against meningococcal disease. You may also need additional booster doses.  Hepatitis A vaccine.** / Consult your health care provider.  Hepatitis B vaccine.** / Consult your health care provider.  Haemophilus influenzae type b (Hib) vaccine.** / Consult your health care provider. Ages 23 to 64years  Blood pressure check.** / Every 1 to 2 years.  Lipid and cholesterol check.** / Every 5 years beginning at age 20 years.  Lung cancer screening. / Every year if you are aged 51 80 years and have a 30-pack-year history of smoking and currently smoke or have quit within the past 15 years. Yearly screening is stopped once you have quit smoking for at least 15 years or develop a health problem that would prevent you from having lung cancer treatment.  Clinical breast exam.** / Every year after age 8 years.  BRCA-related cancer risk assessment.** / For women who have family members with a BRCA-related cancer (breast, ovarian, tubal, or peritoneal cancers).  Mammogram.** / Every year beginning at age 10 years and continuing for as long as you are in good health. Consult with your health care provider.  Pap test.** / Every 3 years starting at age 30 years through age 5 or 61 years with a history of 3 consecutive normal Pap tests.  HPV screening.** / Every 3 years from ages 39 years through ages 72 to 19 years with a history of 3 consecutive normal Pap tests.  Fecal occult blood test (FOBT) of stool. / Every year beginning at age 59 years and continuing until age 27 years. You may not need to do this test if you get a colonoscopy every 10 years.  Flexible sigmoidoscopy or colonoscopy.** / Every 5 years for a flexible sigmoidoscopy or every  10 years for a colonoscopy beginning at age 110 years and continuing until age 63 years.  Hepatitis C blood test.** / For all people born from [birth year] through 1965 and any individual with known risks for hepatitis C.  Skin self-exam. / Monthly.  Influenza vaccine. / Every year.  Tetanus, diphtheria, and acellular pertussis (Tdap/Td) vaccine.** / Consult your health care provider. Pregnant women should receive 1 dose of Tdap vaccine during each pregnancy. 1 dose of Td every 10 years.  Varicella vaccine.** / Consult your health care provider. Pregnant females who do not have evidence of immunity should receive the first dose after pregnancy.  Zoster vaccine.** / 1 dose for adults aged 46 years or older.  Measles, mumps, rubella (MMR) vaccine.** / You need at least 1 dose of MMR if you were born in 1957 or later. You may also need a 2nd dose. For females of childbearing age, rubella immunity should be determined. If there is no evidence of immunity, females who are not pregnant should be vaccinated. If there is no evidence of immunity, females who are pregnant should delay immunization until after pregnancy.  Pneumococcal 13-valent conjugate (PCV13) vaccine.** / Consult your health care provider.  Pneumococcal polysaccharide (PPSV23) vaccine.** / 1  to 2 doses if you smoke cigarettes or if you have certain conditions.  Meningococcal vaccine.** / Consult your health care provider.  Hepatitis A vaccine.** / Consult your health care provider.  Hepatitis B vaccine.** / Consult your health care provider.  Haemophilus influenzae type b (Hib) vaccine.** / Consult your health care provider. Ages 30 years and over  Blood pressure check.** / Every 1 to 2 years.  Lipid and cholesterol check.** / Every 5 years beginning at age 89 years.  Lung cancer screening. / Every year if you are aged 84 80 years and have a 30-pack-year history of smoking and currently smoke or have quit within the past 15 years.  Yearly screening is stopped once you have quit smoking for at least 15 years or develop a health problem that would prevent you from having lung cancer treatment.  Clinical breast exam.** / Every year after age 72 years.  BRCA-related cancer risk assessment.** / For women who have family members with a BRCA-related cancer (breast, ovarian, tubal, or peritoneal cancers).  Mammogram.** / Every year beginning at age 6 years and continuing for as long as you are in good health. Consult with your health care provider.  Pap test.** / Every 3 years starting at age 72 years through age 48 or 30 years with 3 consecutive normal Pap tests. Testing can be stopped between 65 and 70 years with 3 consecutive normal Pap tests and no abnormal Pap or HPV tests in the past 10 years.  HPV screening.** / Every 3 years from ages 69 years through ages 78 or 63 years with a history of 3 consecutive normal Pap tests. Testing can be stopped between 65 and 70 years with 3 consecutive normal Pap tests and no abnormal Pap or HPV tests in the past 10 years.  Fecal occult blood test (FOBT) of stool. / Every year beginning at age 61 years and continuing until age 63 years. You may not need to do this test if you get a colonoscopy every 10 years.  Flexible sigmoidoscopy or colonoscopy.** / Every 5 years for a flexible sigmoidoscopy or every 10 years for a colonoscopy beginning at age 83 years and continuing until age 3 years.  Hepatitis C blood test.** / For all people born from [birth year] through 1965 and any individual with known risks for hepatitis C.  Osteoporosis screening.** / A one-time screening for women ages 105 years and over and women at risk for fractures or osteoporosis.  Skin self-exam. / Monthly.  Influenza vaccine. / Every year.  Tetanus, diphtheria, and acellular pertussis (Tdap/Td) vaccine.** / 1 dose of Td every 10 years.  Varicella vaccine.** / Consult your health care provider.  Zoster vaccine.** / 1  dose for adults aged 35 years or older.  Pneumococcal 13-valent conjugate (PCV13) vaccine.** / Consult your health care provider.  Pneumococcal polysaccharide (PPSV23) vaccine.** / 1 dose for all adults aged 43 years and older.  Meningococcal vaccine.** / Consult your health care provider.  Hepatitis A vaccine.** / Consult your health care provider.  Hepatitis B vaccine.** / Consult your health care provider.  Haemophilus influenzae type b (Hib) vaccine.** / Consult your health care provider. ** Family history and personal history of risk and conditions may change your health care provider's recommendations. Document Released: 10/27/2001 Document Revised: 06/21/2013 Document Reviewed: 01/26/2011 [facility] Patient Information 2014 [place], [place].

## 2014-02-07 NOTE — Progress Notes (Signed)
Subjective:     Marissa Leblanc is a 58 y.o. female and is here for a comprehensive physical exam. The patient reports no problems.  History   Social History  . Marital Status: Divorced    Spouse Name: N/A    Number of Children: N/A  . Years of Education: N/A   Occupational History  . [employer]    Social History Main Topics  . Smoking status: Never Smoker   . Smokeless tobacco: Never Used  . Alcohol Use: No  . Drug Use: No  . Sexual Activity: Not Currently    Partners: Male   Other Topics Concern  . Not on file   Social History Narrative   Exercise-- no---but she will start   Health Maintenance  Topic Date Due  . Influenza Vaccine  04/14/2014  . Pap Smear  10/01/2015  . Mammogram  01/30/2016  . Colonoscopy  06/01/2016  . Tetanus/tdap  07/12/2016    The following portions of the patient's history were reviewed and updated as appropriate:  She  has a past medical history of Hypertension. She  does not have any pertinent problems on file. She  has past surgical history that includes Tear duct probing. Her family history includes Angina in her maternal grandmother; Diabetes in an other family member; Heart attack in her maternal grandmother; Hypertension in her father, mother, and sister; Stroke in her father. She  reports that she has never smoked. She has never used smokeless tobacco. She reports that she does not drink alcohol or use illicit drugs. She has a current medication list which includes the following prescription(s): perindopril and triamterene-hydrochlorothiazide. No current outpatient prescriptions on file prior to visit.   No current facility-administered medications on file prior to visit.   [name] allergic to penicillins..  Review of Systems Review of Systems  Constitutional: Negative for activity change, appetite change and fatigue.  HENT: Negative for hearing loss, congestion, tinnitus and ear discharge.  dentist q39m Eyes: Negative for visual  disturbance (see optho q1y -- vision corrected to 20/20 with glasses).  Respiratory: Negative for cough, chest tightness and shortness of breath.   Cardiovascular: Negative for chest pain, palpitations and leg swelling.  Gastrointestinal: Negative for abdominal pain, diarrhea, constipation and abdominal distention.  Genitourinary: Negative for urgency, frequency, decreased urine volume and difficulty urinating.  Musculoskeletal: Negative for back pain, arthralgias and gait problem.  Skin: Negative for color change, pallor and rash.  Neurological: Negative for dizziness, light-headedness, numbness and headaches.  Hematological: Negative for adenopathy. Does not bruise/bleed easily.  Psychiatric/Behavioral: Negative for suicidal ideas, confusion, sleep disturbance, self-injury, dysphoric mood, decreased concentration and agitation.       Objective:    BP 110/64  Pulse 79  Temp(Src) 98.1 F (36.7 C) (Oral)  Ht 5\' 6"  (1.676 m)  Wt 252 lb (114.306 kg)  BMI 40.69 kg/m2  SpO2 97%  LMP 10/20/2010 General appearance: alert, cooperative, appears stated age and no distress Head: Normocephalic, without obvious abnormality, atraumatic Eyes: conjunctivae/corneas clear. PERRL, EOM's intact. Fundi benign. Ears: normal TM's and external ear canals both ears Nose: Nares normal. Septum midline. Mucosa normal. No drainage or sinus tenderness. Throat: lips, mucosa, and tongue normal; teeth and gums normal Neck: no adenopathy, no carotid bruit, no JVD, supple, symmetrical, trachea midline and thyroid not enlarged, symmetric, no tenderness/mass/nodules Back: symmetric, no curvature. ROM normal. No CVA tenderness. Lungs: clear to auscultation bilaterally Breasts: normal appearance, no masses or tenderness Heart: regular rate and rhythm, S1, S2 normal,  no murmur, click, rub or gallop Abdomen: soft, non-tender; bowel sounds normal; no masses,  no organomegaly Pelvic: cervix normal in appearance, external  genitalia normal, no adnexal masses or tenderness, no cervical motion tenderness, rectovaginal septum normal, uterus normal size, shape, and consistency, vagina normal without discharge and pap done Extremities: extremities normal, atraumatic, no cyanosis or edema Pulses: 2+ and symmetric Skin: Skin color, texture, turgor normal. No rashes or lesions Lymph nodes: Cervical, supraclavicular, and axillary nodes normal. Neurologic: Alert and oriented X 3, normal strength and tone. Normal symmetric reflexes. Normal coordination and gait Psych- no depression, no anxiety      Assessment:  1  Healthy female exam.  Plan:    check labs ghm utd See After Visit Summary for Counseling Recommendations   2.. HTN (hypertension) Stable, con't meds - perindopril (ACEON) 4 MG tablet; Take 1.5 tablets (6 mg total) by mouth daily.  Dispense: 135 tablet; Refill: 3 - triamterene-hydrochlorothiazide (MAXZIDE-25) 37.5-25 MG per tablet; Take 1 tablet by mouth daily.  Dispense: 90 tablet; Refill: 3 - Basic metabolic panel - CBC with Differential - Hepatic function panel - Lipid panel - POCT urinalysis dipstick - TSH

## 2014-02-07 NOTE — Progress Notes (Signed)
Pre visit review using our clinic review tool, if applicable. No additional management support is needed unless otherwise documented below in the visit note. 

## 2014-02-07 NOTE — Addendum Note (Signed)
Addended by: Arnette Norris on: 02/07/2014 09:47 AM   Modules accepted: Orders

## 2014-02-07 NOTE — Telephone Encounter (Signed)
Unable to reach patient pre visit.  

## 2014-02-08 ENCOUNTER — Telehealth: Payer: Self-pay | Admitting: Family Medicine

## 2014-02-08 NOTE — Telephone Encounter (Signed)
Relevant patient education mailed to patient.  

## 2014-02-12 ENCOUNTER — Other Ambulatory Visit: Payer: Self-pay

## 2014-02-12 MED ORDER — POTASSIUM CHLORIDE CRYS ER 20 MEQ PO TBCR: 20.0000 meq | EXTENDED_RELEASE_TABLET | Freq: Every day | ORAL | Status: DC

## 2014-06-29 ENCOUNTER — Other Ambulatory Visit: Payer: Self-pay

## 2014-08-20 ENCOUNTER — Encounter: Payer: Self-pay | Admitting: Family Medicine

## 2014-08-20 ENCOUNTER — Ambulatory Visit (INDEPENDENT_AMBULATORY_CARE_PROVIDER_SITE_OTHER): Payer: BC Managed Care – PPO | Admitting: Family Medicine

## 2014-08-20 VITALS — BP 112/74 | HR 73 | Temp 99.4°F | Wt 259.8 lb

## 2014-08-20 DIAGNOSIS — E785 Hyperlipidemia, unspecified: Secondary | ICD-10-CM

## 2014-08-20 DIAGNOSIS — I10 Essential (primary) hypertension: Secondary | ICD-10-CM

## 2014-08-20 LAB — LIPID PANEL
CHOLESTEROL: 144 mg/dL (ref 0–200)
HDL: 42.5 mg/dL (ref >39.00)
LDL Cholesterol: 88 mg/dL (ref 0–99)
NonHDL: 101.5
Total CHOL/HDL Ratio: 3
Triglycerides: 66 mg/dL (ref 0.0–149.0)
VLDL: 13.2 mg/dL (ref 0.0–40.0)

## 2014-08-20 LAB — BASIC METABOLIC PANEL
BUN: 10 mg/dL (ref 6–23)
CALCIUM: 9.1 mg/dL (ref 8.4–10.5)
CO2: 31 mEq/L (ref 19–32)
Chloride: 102 mEq/L (ref 96–112)
Creatinine, Ser: 1 mg/dL (ref 0.4–1.2)
GFR: 63.4 mL/min (ref >60.00)
Glucose, Bld: 148 mg/dL — ABNORMAL HIGH (ref 70–99)
POTASSIUM: 4.2 meq/L (ref 3.5–5.1)
Sodium: 138 mEq/L (ref 135–145)

## 2014-08-20 LAB — POCT URINALYSIS DIPSTICK
BILIRUBIN UA: NEGATIVE
Blood, UA: NEGATIVE
GLUCOSE UA: NEGATIVE
KETONES UA: NEGATIVE
LEUKOCYTES UA: NEGATIVE
Nitrite, UA: NEGATIVE
Protein, UA: NEGATIVE
SPEC GRAV UA: 1.015
Urobilinogen, UA: 0.2
pH, UA: 6.5

## 2014-08-20 LAB — MICROALBUMIN / CREATININE URINE RATIO
Creatinine,U: 71.9 mg/dL
Microalb Creat Ratio: 0.3 mg/g (ref 0.0–30.0)
Microalb, Ur: 0.2 mg/dL (ref 0.0–1.9)

## 2014-08-20 LAB — HEPATIC FUNCTION PANEL
ALBUMIN: 4.2 g/dL (ref 3.5–5.2)
ALT: 19 U/L (ref 0–35)
AST: 15 U/L (ref 0–37)
Alkaline Phosphatase: 53 U/L (ref 39–117)
Bilirubin, Direct: 0.2 mg/dL (ref 0.0–0.3)
Total Bilirubin: 1.5 mg/dL — ABNORMAL HIGH (ref 0.2–1.2)
Total Protein: 6.9 g/dL (ref 6.0–8.3)

## 2014-08-20 NOTE — Progress Notes (Signed)
Pre visit review using our clinic review tool, if applicable. No additional management support is needed unless otherwise documented below in the visit note. 

## 2014-08-20 NOTE — Progress Notes (Signed)
  Subjective:    Patient here for follow-up of elevated blood pressure.  [name] not exercising and is not adherent to a low-salt diet.  Blood pressure is well controlled at home. Cardiac symptoms: none. Patient denies: chest pain, chest pressure/discomfort, claudication, dyspnea, exertional chest pressure/discomfort, fatigue, irregular heart beat, lower extremity edema, near-syncope, orthopnea, palpitations, paroxysmal nocturnal dyspnea, syncope and tachypnea. Cardiovascular risk factors: dyslipidemia, hypertension, obesity (BMI >= 30 kg/m2) and sedentary lifestyle. Use of agents associated with hypertension: none. History of target organ damage: none.  The following portions of the patient's history were reviewed and updated as appropriate: allergies, current medications, past family history, past medical history, past social history, past surgical history and problem list.  Review of Systems Pertinent items are noted in HPI.     Objective:    BP 112/74 mmHg  Pulse 73  Temp(Src) 99.4 F (37.4 C) (Oral)  Wt 259 lb 12.8 oz (117.845 kg)  SpO2 97%  LMP 10/20/2010 General appearance: alert, cooperative, appears stated age and no distress Neck: no adenopathy, supple, symmetrical, trachea midline and thyroid not enlarged, symmetric, no tenderness/mass/nodules Lungs: clear to auscultation bilaterally Heart: S1, S2 normal Extremities: extremities normal, atraumatic, no cyanosis or edema    Assessment:    Hypertension, normal blood pressure . Evidence of target organ damage: none.    Plan:    Medication: no change. Dietary sodium restriction. Regular aerobic exercise. Follow up: 6 months and as needed.    1. Essential hypertension   - Basic metabolic panel - Hepatic function panel - Lipid panel - Microalbumin / creatinine urine ratio - POCT urinalysis dipstick  2. Hyperlipidemia Check labs - Basic metabolic panel - Hepatic function panel - Lipid panel - Microalbumin /  creatinine urine ratio - POCT urinalysis dipstick

## 2014-08-20 NOTE — Patient Instructions (Signed)
Preventive Care for Adults A healthy lifestyle and preventive care can promote health and wellness. Preventive health guidelines for women include the following key practices.  A routine yearly physical is a good way to check with your health care provider about your health and preventive screening. It is a chance to share any concerns and updates on your health and to receive a thorough exam.  Visit your dentist for a routine exam and preventive care every 6 months. Brush your teeth twice a day and floss once a day. Good oral hygiene prevents tooth decay and gum disease.  The frequency of eye exams is based on your age, health, family medical history, use of contact lenses, and other factors. Follow your health care provider's recommendations for frequency of eye exams.  Eat a healthy diet. Foods like vegetables, fruits, whole grains, low-fat dairy products, and lean protein foods contain the nutrients you need without too many calories. Decrease your intake of foods high in solid fats, added sugars, and salt. Eat the right amount of calories for you.Get information about a proper diet from your health care provider, if necessary.  Regular physical exercise is one of the most important things you can do for your health. Most adults should get at least 150 minutes of moderate-intensity exercise (any activity that increases your heart rate and causes you to sweat) each week. In addition, most adults need muscle-strengthening exercises on 2 or more days a week.  Maintain a healthy weight. The body mass index (BMI) is a screening tool to identify possible weight problems. It provides an estimate of body fat based on height and weight. Your health care provider can find your BMI and can help you achieve or maintain a healthy weight.For adults 20 years and older:  A BMI below 18.5 is considered underweight.  A BMI of 18.5 to 24.9 is normal.  A BMI of 25 to 29.9 is considered overweight.  A BMI of  30 and above is considered obese.  Maintain normal blood lipids and cholesterol levels by exercising and minimizing your intake of saturated fat. Eat a balanced diet with plenty of fruit and vegetables. Blood tests for lipids and cholesterol should begin at age 76 and be repeated every 5 years. If your lipid or cholesterol levels are high, you are over 50, or you are at high risk for heart disease, you may need your cholesterol levels checked more frequently.Ongoing high lipid and cholesterol levels should be treated with medicines if diet and exercise are not working.  If you smoke, find out from your health care provider how to quit. If you do not use tobacco, do not start.  Lung cancer screening is recommended for adults aged 22-80 years who are at high risk for developing lung cancer because of a history of smoking. A yearly low-dose CT scan of the lungs is recommended for people who have at least a 30-pack-year history of smoking and are a current smoker or have quit within the past 15 years. A pack year of smoking is smoking an average of 1 pack of cigarettes a day for 1 year (for example: 1 pack a day for 30 years or 2 packs a day for 15 years). Yearly screening should continue until the smoker has stopped smoking for at least 15 years. Yearly screening should be stopped for people who develop a health problem that would prevent them from having lung cancer treatment.  If you are pregnant, do not drink alcohol. If you are breastfeeding,  be very cautious about drinking alcohol. If you are not pregnant and choose to drink alcohol, do not have more than 1 drink per day. One drink is considered to be 12 ounces (355 mL) of beer, 5 ounces (148 mL) of wine, or 1.5 ounces (44 mL) of liquor.  Avoid use of street drugs. Do not share needles with anyone. Ask for help if you need support or instructions about stopping the use of drugs.  High blood pressure causes heart disease and increases the risk of  stroke. Your blood pressure should be checked at least every 1 to 2 years. Ongoing high blood pressure should be treated with medicines if weight loss and exercise do not work.  If you are 3-86 years old, ask your health care provider if you should take aspirin to prevent strokes.  Diabetes screening involves taking a blood sample to check your fasting blood sugar level. This should be done once every 3 years, after age 67, if you are within normal weight and without risk factors for diabetes. Testing should be considered at a younger age or be carried out more frequently if you are overweight and have at least 1 risk factor for diabetes.  Breast cancer screening is essential preventive care for women. You should practice "breast self-awareness." This means understanding the normal appearance and feel of your breasts and may include breast self-examination. Any changes detected, no matter how small, should be reported to a health care provider. Women in their 8s and 30s should have a clinical breast exam (CBE) by a health care provider as part of a regular health exam every 1 to 3 years. After age 70, women should have a CBE every year. Starting at age 25, women should consider having a mammogram (breast X-ray test) every year. Women who have a family history of breast cancer should talk to their health care provider about genetic screening. Women at a high risk of breast cancer should talk to their health care providers about having an MRI and a mammogram every year.  Breast cancer gene (BRCA)-related cancer risk assessment is recommended for women who have family members with BRCA-related cancers. BRCA-related cancers include breast, ovarian, tubal, and peritoneal cancers. Having family members with these cancers may be associated with an increased risk for harmful changes (mutations) in the breast cancer genes BRCA1 and BRCA2. Results of the assessment will determine the need for genetic counseling and  BRCA1 and BRCA2 testing.  Routine pelvic exams to screen for cancer are no longer recommended for nonpregnant women who are considered low risk for cancer of the pelvic organs (ovaries, uterus, and vagina) and who do not have symptoms. Ask your health care provider if a screening pelvic exam is right for you.  If you have had past treatment for cervical cancer or a condition that could lead to cancer, you need Pap tests and screening for cancer for at least 20 years after your treatment. If Pap tests have been discontinued, your risk factors (such as having a new sexual partner) need to be reassessed to determine if screening should be resumed. Some women have medical problems that increase the chance of getting cervical cancer. In these cases, your health care provider may recommend more frequent screening and Pap tests.  The HPV test is an additional test that may be used for cervical cancer screening. The HPV test looks for the virus that can cause the cell changes on the cervix. The cells collected during the Pap test can be  tested for HPV. The HPV test could be used to screen women aged 30 years and older, and should be used in women of any age who have unclear Pap test results. After the age of 30, women should have HPV testing at the same frequency as a Pap test.  Colorectal cancer can be detected and often prevented. Most routine colorectal cancer screening begins at the age of 50 years and continues through age 75 years. However, your health care provider may recommend screening at an earlier age if you have risk factors for colon cancer. On a yearly basis, your health care provider may provide home test kits to check for hidden blood in the stool. Use of a small camera at the end of a tube, to directly examine the colon (sigmoidoscopy or colonoscopy), can detect the earliest forms of colorectal cancer. Talk to your health care provider about this at age 50, when routine screening begins. Direct  exam of the colon should be repeated every 5-10 years through age 75 years, unless early forms of pre-cancerous polyps or small growths are found.  People who are at an increased risk for hepatitis B should be screened for this virus. You are considered at high risk for hepatitis B if:  You were born in a country where hepatitis B occurs often. Talk with your health care provider about which countries are considered high risk.  Your parents were born in a high-risk country and you have not received a shot to protect against hepatitis B (hepatitis B vaccine).  You have HIV or AIDS.  You use needles to inject street drugs.  You live with, or have sex with, someone who has hepatitis B.  You get hemodialysis treatment.  You take certain medicines for conditions like cancer, organ transplantation, and autoimmune conditions.  Hepatitis C blood testing is recommended for all people born from 1945 through 1965 and any individual with known risks for hepatitis C.  Practice safe sex. Use condoms and avoid high-risk sexual practices to reduce the spread of sexually transmitted infections (STIs). STIs include gonorrhea, chlamydia, syphilis, trichomonas, herpes, HPV, and human immunodeficiency virus (HIV). Herpes, HIV, and HPV are viral illnesses that have no cure. They can result in disability, cancer, and death.  You should be screened for sexually transmitted illnesses (STIs) including gonorrhea and chlamydia if:  You are sexually active and are younger than 24 years.  You are older than 24 years and your health care provider tells you that you are at risk for this type of infection.  Your sexual activity has changed since you were last screened and you are at an increased risk for chlamydia or gonorrhea. Ask your health care provider if you are at risk.  If you are at risk of being infected with HIV, it is recommended that you take a prescription medicine daily to prevent HIV infection. This is  called preexposure prophylaxis (PrEP). You are considered at risk if:  You are a heterosexual woman, are sexually active, and are at increased risk for HIV infection.  You take drugs by injection.  You are sexually active with a partner who has HIV.  Talk with your health care provider about whether you are at high risk of being infected with HIV. If you choose to begin PrEP, you should first be tested for HIV. You should then be tested every 3 months for as long as you are taking PrEP.  Osteoporosis is a disease in which the bones lose minerals and strength   with aging. This can result in serious bone fractures or breaks. The risk of osteoporosis can be identified using a bone density scan. Women ages 65 years and over and women at risk for fractures or osteoporosis should discuss screening with their health care providers. Ask your health care provider whether you should take a calcium supplement or vitamin D to reduce the rate of osteoporosis.  Menopause can be associated with physical symptoms and risks. Hormone replacement therapy is available to decrease symptoms and risks. You should talk to your health care provider about whether hormone replacement therapy is right for you.  Use sunscreen. Apply sunscreen liberally and repeatedly throughout the day. You should seek shade when your shadow is shorter than you. Protect yourself by wearing long sleeves, pants, a wide-brimmed hat, and sunglasses year round, whenever you are outdoors.  Once a month, do a whole body skin exam, using a mirror to look at the skin on your back. Tell your health care provider of new moles, moles that have irregular borders, moles that are larger than a pencil eraser, or moles that have changed in shape or color.  Stay current with required vaccines (immunizations).  Influenza vaccine. All adults should be immunized every year.  Tetanus, diphtheria, and acellular pertussis (Td, Tdap) vaccine. Pregnant women should  receive 1 dose of Tdap vaccine during each pregnancy. The dose should be obtained regardless of the length of time since the last dose. Immunization is preferred during the 27th-36th week of gestation. An adult who has not previously received Tdap or who does not know her vaccine status should receive 1 dose of Tdap. This initial dose should be followed by tetanus and diphtheria toxoids (Td) booster doses every 10 years. Adults with an unknown or incomplete history of completing a 3-dose immunization series with Td-containing vaccines should begin or complete a primary immunization series including a Tdap dose. Adults should receive a Td booster every 10 years.  Varicella vaccine. An adult without evidence of immunity to varicella should receive 2 doses or a second dose if she has previously received 1 dose. Pregnant females who do not have evidence of immunity should receive the first dose after pregnancy. This first dose should be obtained before leaving the health care facility. The second dose should be obtained 4-8 weeks after the first dose.  Human papillomavirus (HPV) vaccine. Females aged 13-26 years who have not received the vaccine previously should obtain the 3-dose series. The vaccine is not recommended for use in pregnant females. However, pregnancy testing is not needed before receiving a dose. If a female is found to be pregnant after receiving a dose, no treatment is needed. In that case, the remaining doses should be delayed until after the pregnancy. Immunization is recommended for any person with an immunocompromised condition through the age of 26 years if she did not get any or all doses earlier. During the 3-dose series, the second dose should be obtained 4-8 weeks after the first dose. The third dose should be obtained 24 weeks after the first dose and 16 weeks after the second dose.  Zoster vaccine. One dose is recommended for adults aged 60 years or older unless certain conditions are  present.  Measles, mumps, and rubella (MMR) vaccine. Adults born before 1957 generally are considered immune to measles and mumps. Adults born in 1957 or later should have 1 or more doses of MMR vaccine unless there is a contraindication to the vaccine or there is laboratory evidence of immunity to   each of the three diseases. A routine second dose of MMR vaccine should be obtained at least 28 days after the first dose for students attending postsecondary schools, health care workers, or international travelers. People who received inactivated measles vaccine or an unknown type of measles vaccine during 1963-1967 should receive 2 doses of MMR vaccine. People who received inactivated mumps vaccine or an unknown type of mumps vaccine before 1979 and are at high risk for mumps infection should consider immunization with 2 doses of MMR vaccine. For females of childbearing age, rubella immunity should be determined. If there is no evidence of immunity, females who are not pregnant should be vaccinated. If there is no evidence of immunity, females who are pregnant should delay immunization until after pregnancy. Unvaccinated health care workers born before 1957 who lack laboratory evidence of measles, mumps, or rubella immunity or laboratory confirmation of disease should consider measles and mumps immunization with 2 doses of MMR vaccine or rubella immunization with 1 dose of MMR vaccine.  Pneumococcal 13-valent conjugate (PCV13) vaccine. When indicated, a person who is uncertain of her immunization history and has no record of immunization should receive the PCV13 vaccine. An adult aged 19 years or older who has certain medical conditions and has not been previously immunized should receive 1 dose of PCV13 vaccine. This PCV13 should be followed with a dose of pneumococcal polysaccharide (PPSV23) vaccine. The PPSV23 vaccine dose should be obtained at least 8 weeks after the dose of PCV13 vaccine. An adult aged 19  years or older who has certain medical conditions and previously received 1 or more doses of PPSV23 vaccine should receive 1 dose of PCV13. The PCV13 vaccine dose should be obtained 1 or more years after the last PPSV23 vaccine dose.  Pneumococcal polysaccharide (PPSV23) vaccine. When PCV13 is also indicated, PCV13 should be obtained first. All adults aged 65 years and older should be immunized. An adult younger than age 65 years who has certain medical conditions should be immunized. Any person who resides in a nursing home or long-term care facility should be immunized. An adult smoker should be immunized. People with an immunocompromised condition and certain other conditions should receive both PCV13 and PPSV23 vaccines. People with human immunodeficiency virus (HIV) infection should be immunized as soon as possible after diagnosis. Immunization during chemotherapy or radiation therapy should be avoided. Routine use of PPSV23 vaccine is not recommended for American Indians, [place] Natives, or people younger than 65 years unless there are medical conditions that require PPSV23 vaccine. When indicated, people who have unknown immunization and have no record of immunization should receive PPSV23 vaccine. One-time revaccination 5 years after the first dose of PPSV23 is recommended for people aged 19-64 years who have chronic kidney failure, nephrotic syndrome, asplenia, or immunocompromised conditions. People who received 1-2 doses of PPSV23 before age 65 years should receive another dose of PPSV23 vaccine at age 65 years or later if at least 5 years have passed since the previous dose. Doses of PPSV23 are not needed for people immunized with PPSV23 at or after age 65 years.  Meningococcal vaccine. Adults with asplenia or persistent complement component deficiencies should receive 2 doses of quadrivalent meningococcal conjugate (MenACWY-D) vaccine. The doses should be obtained at least 2 months apart.  Microbiologists working with certain meningococcal bacteria, military recruits, people at risk during an outbreak, and people who travel to or live in countries with a high rate of meningitis should be immunized. A first-year college student up through age   21 years who is living in a residence hall should receive a dose if she did not receive a dose on or after her 16th birthday. Adults who have certain high-risk conditions should receive one or more doses of vaccine.  Hepatitis A vaccine. Adults who wish to be protected from this disease, have certain high-risk conditions, work with hepatitis A-infected animals, work in hepatitis A research labs, or travel to or work in countries with a high rate of hepatitis A should be immunized. Adults who were previously unvaccinated and who anticipate close contact with an international adoptee during the first 60 days after arrival in the [place] from a country with a high rate of hepatitis A should be immunized.  Hepatitis B vaccine. Adults who wish to be protected from this disease, have certain high-risk conditions, may be exposed to blood or other infectious body fluids, are household contacts or sex partners of hepatitis B positive people, are clients or workers in certain care facilities, or travel to or work in countries with a high rate of hepatitis B should be immunized.  Haemophilus influenzae type b (Hib) vaccine. A previously unvaccinated person with asplenia or sickle cell disease or having a scheduled splenectomy should receive 1 dose of Hib vaccine. Regardless of previous immunization, a recipient of a hematopoietic stem cell transplant should receive a 3-dose series 6-12 months after her successful transplant. Hib vaccine is not recommended for adults with HIV infection. Preventive Services / Frequency Ages 64 to 68 years  Blood pressure check.** / Every 1 to 2 years.  Lipid and cholesterol check.** / Every 5 years beginning at age  22.  Clinical breast exam.** / Every 3 years for women in their 88s and 53s.  BRCA-related cancer risk assessment.** / For women who have family members with a BRCA-related cancer (breast, ovarian, tubal, or peritoneal cancers).  Pap test.** / Every 2 years from ages 90 through 51. Every 3 years starting at age 21 through age 56 or 3 with a history of 3 consecutive normal Pap tests.  HPV screening.** / Every 3 years from ages 24 through ages 1 to 46 with a history of 3 consecutive normal Pap tests.  Hepatitis C blood test.** / For any individual with known risks for hepatitis C.  Skin self-exam. / Monthly.  Influenza vaccine. / Every year.  Tetanus, diphtheria, and acellular pertussis (Tdap, Td) vaccine.** / Consult your health care provider. Pregnant women should receive 1 dose of Tdap vaccine during each pregnancy. 1 dose of Td every 10 years.  Varicella vaccine.** / Consult your health care provider. Pregnant females who do not have evidence of immunity should receive the first dose after pregnancy.  HPV vaccine. / 3 doses over 6 months, if 72 and younger. The vaccine is not recommended for use in pregnant females. However, pregnancy testing is not needed before receiving a dose.  Measles, mumps, rubella (MMR) vaccine.** / You need at least 1 dose of MMR if you were born in 1957 or later. You may also need a 2nd dose. For females of childbearing age, rubella immunity should be determined. If there is no evidence of immunity, females who are not pregnant should be vaccinated. If there is no evidence of immunity, females who are pregnant should delay immunization until after pregnancy.  Pneumococcal 13-valent conjugate (PCV13) vaccine.** / Consult your health care provider.  Pneumococcal polysaccharide (PPSV23) vaccine.** / 1 to 2 doses if you smoke cigarettes or if you have certain conditions.  Meningococcal vaccine.** /  1 dose if you are age 19 to 21 years and a first-year college  student living in a residence hall, or have one of several medical conditions, you need to get vaccinated against meningococcal disease. You may also need additional booster doses.  Hepatitis A vaccine.** / Consult your health care provider.  Hepatitis B vaccine.** / Consult your health care provider.  Haemophilus influenzae type b (Hib) vaccine.** / Consult your health care provider. Ages 40 to 64 years  Blood pressure check.** / Every 1 to 2 years.  Lipid and cholesterol check.** / Every 5 years beginning at age 20 years.  Lung cancer screening. / Every year if you are aged 55-80 years and have a 30-pack-year history of smoking and currently smoke or have quit within the past 15 years. Yearly screening is stopped once you have quit smoking for at least 15 years or develop a health problem that would prevent you from having lung cancer treatment.  Clinical breast exam.** / Every year after age 40 years.  BRCA-related cancer risk assessment.** / For women who have family members with a BRCA-related cancer (breast, ovarian, tubal, or peritoneal cancers).  Mammogram.** / Every year beginning at age 40 years and continuing for as long as you are in good health. Consult with your health care provider.  Pap test.** / Every 3 years starting at age 30 years through age 65 or 70 years with a history of 3 consecutive normal Pap tests.  HPV screening.** / Every 3 years from ages 30 years through ages 65 to 70 years with a history of 3 consecutive normal Pap tests.  Fecal occult blood test (FOBT) of stool. / Every year beginning at age 50 years and continuing until age 75 years. You may not need to do this test if you get a colonoscopy every 10 years.  Flexible sigmoidoscopy or colonoscopy.** / Every 5 years for a flexible sigmoidoscopy or every 10 years for a colonoscopy beginning at age 50 years and continuing until age 75 years.  Hepatitis C blood test.** / For all people born from 1945 through  1965 and any individual with known risks for hepatitis C.  Skin self-exam. / Monthly.  Influenza vaccine. / Every year.  Tetanus, diphtheria, and acellular pertussis (Tdap/Td) vaccine.** / Consult your health care provider. Pregnant women should receive 1 dose of Tdap vaccine during each pregnancy. 1 dose of Td every 10 years.  Varicella vaccine.** / Consult your health care provider. Pregnant females who do not have evidence of immunity should receive the first dose after pregnancy.  Zoster vaccine.** / 1 dose for adults aged 60 years or older.  Measles, mumps, rubella (MMR) vaccine.** / You need at least 1 dose of MMR if you were born in 1957 or later. You may also need a 2nd dose. For females of childbearing age, rubella immunity should be determined. If there is no evidence of immunity, females who are not pregnant should be vaccinated. If there is no evidence of immunity, females who are pregnant should delay immunization until after pregnancy.  Pneumococcal 13-valent conjugate (PCV13) vaccine.** / Consult your health care provider.  Pneumococcal polysaccharide (PPSV23) vaccine.** / 1 to 2 doses if you smoke cigarettes or if you have certain conditions.  Meningococcal vaccine.** / Consult your health care provider.  Hepatitis A vaccine.** / Consult your health care provider.  Hepatitis B vaccine.** / Consult your health care provider.  Haemophilus influenzae type b (Hib) vaccine.** / Consult your health care provider. Ages 65   years and over  Blood pressure check.** / Every 1 to 2 years.  Lipid and cholesterol check.** / Every 5 years beginning at age 22 years.  Lung cancer screening. / Every year if you are aged 73-80 years and have a 30-pack-year history of smoking and currently smoke or have quit within the past 15 years. Yearly screening is stopped once you have quit smoking for at least 15 years or develop a health problem that would prevent you from having lung cancer  treatment.  Clinical breast exam.** / Every year after age 4 years.  BRCA-related cancer risk assessment.** / For women who have family members with a BRCA-related cancer (breast, ovarian, tubal, or peritoneal cancers).  Mammogram.** / Every year beginning at age 40 years and continuing for as long as you are in good health. Consult with your health care provider.  Pap test.** / Every 3 years starting at age 9 years through age 34 or 91 years with 3 consecutive normal Pap tests. Testing can be stopped between 65 and 70 years with 3 consecutive normal Pap tests and no abnormal Pap or HPV tests in the past 10 years.  HPV screening.** / Every 3 years from ages 57 years through ages 64 or 45 years with a history of 3 consecutive normal Pap tests. Testing can be stopped between 65 and 70 years with 3 consecutive normal Pap tests and no abnormal Pap or HPV tests in the past 10 years.  Fecal occult blood test (FOBT) of stool. / Every year beginning at age 15 years and continuing until age 17 years. You may not need to do this test if you get a colonoscopy every 10 years.  Flexible sigmoidoscopy or colonoscopy.** / Every 5 years for a flexible sigmoidoscopy or every 10 years for a colonoscopy beginning at age 86 years and continuing until age 71 years.  Hepatitis C blood test.** / For all people born from [birth year] through 1965 and any individual with known risks for hepatitis C.  Osteoporosis screening.** / A one-time screening for women ages 83 years and over and women at risk for fractures or osteoporosis.  Skin self-exam. / Monthly.  Influenza vaccine. / Every year.  Tetanus, diphtheria, and acellular pertussis (Tdap/Td) vaccine.** / 1 dose of Td every 10 years.  Varicella vaccine.** / Consult your health care provider.  Zoster vaccine.** / 1 dose for adults aged 61 years or older.  Pneumococcal 13-valent conjugate (PCV13) vaccine.** / Consult your health care provider.  Pneumococcal  polysaccharide (PPSV23) vaccine.** / 1 dose for all adults aged 28 years and older.  Meningococcal vaccine.** / Consult your health care provider.  Hepatitis A vaccine.** / Consult your health care provider.  Hepatitis B vaccine.** / Consult your health care provider.  Haemophilus influenzae type b (Hib) vaccine.** / Consult your health care provider. ** Family history and personal history of risk and conditions may change your health care provider's recommendations. Document Released: 10/27/2001 Document Revised: 01/15/2014 Document Reviewed: 01/26/2011 [facility] Patient Information 2015 [place], [place]. This information is not intended to replace advice given to you by your health care provider. Make sure you discuss any questions you have with your health care provider.

## 2015-03-12 ENCOUNTER — Encounter: Payer: Self-pay | Admitting: Family Medicine

## 2015-03-12 ENCOUNTER — Ambulatory Visit (INDEPENDENT_AMBULATORY_CARE_PROVIDER_SITE_OTHER): Payer: BLUE CROSS/BLUE SHIELD | Admitting: Family Medicine

## 2015-03-12 ENCOUNTER — Other Ambulatory Visit: Payer: Self-pay

## 2015-03-12 VITALS — BP 114/78 | HR 86 | Temp 98.4°F | Resp 18 | Ht 66.0 in | Wt 258.2 lb

## 2015-03-12 DIAGNOSIS — I10 Essential (primary) hypertension: Secondary | ICD-10-CM

## 2015-03-12 DIAGNOSIS — Z1231 Encounter for screening mammogram for malignant neoplasm of breast: Secondary | ICD-10-CM

## 2015-03-12 DIAGNOSIS — Z0000 Encounter for general adult medical examination without abnormal findings: Secondary | ICD-10-CM | POA: Diagnosis not present

## 2015-03-12 MED ORDER — TRIAMTERENE-HCTZ 37.5-25 MG PO TABS: 1.0000 | ORAL_TABLET | Freq: Every day | ORAL | Status: DC

## 2015-03-12 MED ORDER — PERINDOPRIL ERBUMINE 4 MG PO TABS: 6.0000 mg | ORAL_TABLET | Freq: Every day | ORAL | Status: DC

## 2015-03-12 NOTE — Progress Notes (Signed)
Subjective:     Marissa Leblanc is a 59 y.o. female and is here for a comprehensive physical exam. The patient reports no problems.  History   Social History  . Marital Status: Divorced    Spouse Name: N/A  . Number of Children: N/A  . Years of Education: N/A   Occupational History  . [employer]    Social History Main Topics  . Smoking status: Never Smoker   . Smokeless tobacco: Never Used  . Alcohol Use: 0.0 oz/week    0 Standard drinks or equivalent per week     Comment: rare  . Drug Use: No  . Sexual Activity:    Partners: Male   Other Topics Concern  . Not on file   Social History Narrative   Exercise-- no---but she will start   Health Maintenance  Topic Date Due  . HIV Screening  03/11/2016 (Originally 05/30/1971)  . INFLUENZA VACCINE  04/15/2015  . MAMMOGRAM  01/30/2016  . COLONOSCOPY  06/01/2016  . TETANUS/TDAP  07/12/2016  . PAP SMEAR  02/07/2017    The following portions of the patient's history were reviewed and updated as appropriate:  She  has a past medical history of Hypertension. She  does not have any pertinent problems on file. She  has past surgical history that includes Tear duct probing. Her family history includes Angina in her maternal grandmother; Diabetes in her sister and another family member; Heart attack in her maternal grandmother; Hypertension in her father, mother, and sister; Stroke in her father. She  reports that she has never smoked. She has never used smokeless tobacco. She reports that she drinks alcohol. She reports that she does not use illicit drugs. She has a current medication list which includes the following prescription(s): perindopril and triamterene-hydrochlorothiazide. No current outpatient prescriptions on file prior to visit.   No current facility-administered medications on file prior to visit.   [name] allergic to penicillins..  Review of Systems Review of Systems  Constitutional: Negative for activity  change, appetite change and fatigue.  HENT: Negative for hearing loss, congestion, tinnitus and ear discharge.  dentist [id number] Eyes: Negative for visual disturbance (see optho q1y -- vision corrected to 20/20 with glasses).  Respiratory: Negative for cough, chest tightness and shortness of breath.   Cardiovascular: Negative for chest pain, palpitations and leg swelling.  Gastrointestinal: Negative for abdominal pain, diarrhea, constipation and abdominal distention.  Genitourinary: Negative for urgency, frequency, decreased urine volume and difficulty urinating.  Musculoskeletal: Negative for back pain, arthralgias and gait problem.  Skin: Negative for color change, pallor and rash.  Neurological: Negative for dizziness, light-headedness, numbness and headaches.  Hematological: Negative for adenopathy. Does not bruise/bleed easily.  Psychiatric/Behavioral: Negative for suicidal ideas, confusion, sleep disturbance, self-injury, dysphoric mood, decreased concentration and agitation.       Objective:    BP 114/78 mmHg  Pulse 86  Temp(Src) 98.4 F (36.9 C) (Oral)  Resp 18  Ht 5\' 6"  (1.676 m)  Wt 258 lb 3.2 oz (117.119 kg)  BMI 41.69 kg/m2  SpO2 98%  LMP 10/20/2010 General appearance: alert, cooperative, appears stated age and no distress Head: Normocephalic, without obvious abnormality, atraumatic Eyes: conjunctivae/corneas clear. PERRL, EOM's intact. Fundi benign. Ears: normal TM's and external ear canals both ears Nose: Nares normal. Septum midline. Mucosa normal. No drainage or sinus tenderness. Throat: lips, mucosa, and tongue normal; teeth and gums normal Neck: no adenopathy, no carotid bruit, no JVD, supple, symmetrical, trachea midline and thyroid not  enlarged, symmetric, no tenderness/mass/nodules Back: symmetric, no curvature. ROM normal. No CVA tenderness. Lungs: clear to auscultation bilaterally Breasts: normal appearance, no masses or tenderness Heart: regular rate and  rhythm, S1, S2 normal, no murmur, click, rub or gallop Abdomen: soft, non-tender; bowel sounds normal; no masses,  no organomegaly Pelvic: deferred Extremities: extremities normal, atraumatic, no cyanosis or edema Pulses: 2+ and symmetric Skin: Skin color, texture, turgor normal. No rashes or lesions Lymph nodes: Cervical, supraclavicular, and axillary nodes normal. Neurologic: Alert and oriented X 3, normal strength and tone. Normal symmetric reflexes. Normal coordination and gait Psych- no depression, no anxiety      Assessment:    Healthy female exam.      Plan:    ghm utd Check labs See After Visit Summary for Counseling Recommendations    1. Essential hypertension Stable- con't meds - triamterene-hydrochlorothiazide (MAXZIDE-25) 37.5-25 MG per tablet; Take 1 tablet by mouth daily.  Dispense: 90 tablet; Refill: 3 - perindopril (ACEON) 4 MG tablet; Take 1.5 tablets (6 mg total) by mouth daily.  Dispense: 135 tablet; Refill: 3 - Basic metabolic panel; Future - CBC with Differential/Platelet; Future - Hepatic function panel; Future - Lipid panel; Future - POCT urinalysis dipstick; Future - Microalbumin / creatinine urine ratio; Future - TSH; Future  2. Preventative health care See avs - Basic metabolic panel; Future - CBC with Differential/Platelet; Future - Hepatic function panel; Future - Lipid panel; Future - POCT urinalysis dipstick; Future - Microalbumin / creatinine urine ratio; Future - TSH; Future

## 2015-03-12 NOTE — Patient Instructions (Signed)
Preventive Care for Adults A healthy lifestyle and preventive care can promote health and wellness. Preventive health guidelines for women include the following key practices.  A routine yearly physical is a good way to check with your health care provider about your health and preventive screening. It is a chance to share any concerns and updates on your health and to receive a thorough exam.  Visit your dentist for a routine exam and preventive care every 6 months. Brush your teeth twice a day and floss once a day. Good oral hygiene prevents tooth decay and gum disease.  The frequency of eye exams is based on your age, health, family medical history, use of contact lenses, and other factors. Follow your health care provider's recommendations for frequency of eye exams.  Eat a healthy diet. Foods like vegetables, fruits, whole grains, low-fat dairy products, and lean protein foods contain the nutrients you need without too many calories. Decrease your intake of foods high in solid fats, added sugars, and salt. Eat the right amount of calories for you.Get information about a proper diet from your health care provider, if necessary.  Regular physical exercise is one of the most important things you can do for your health. Most adults should get at least 150 minutes of moderate-intensity exercise (any activity that increases your heart rate and causes you to sweat) each week. In addition, most adults need muscle-strengthening exercises on 2 or more days a week.  Maintain a healthy weight. The body mass index (BMI) is a screening tool to identify possible weight problems. It provides an estimate of body fat based on height and weight. Your health care provider can find your BMI and can help you achieve or maintain a healthy weight.For adults 20 years and older:  A BMI below 18.5 is considered underweight.  A BMI of 18.5 to 24.9 is normal.  A BMI of 25 to 29.9 is considered overweight.  A BMI of  30 and above is considered obese.  Maintain normal blood lipids and cholesterol levels by exercising and minimizing your intake of saturated fat. Eat a balanced diet with plenty of fruit and vegetables. Blood tests for lipids and cholesterol should begin at age 76 and be repeated every 5 years. If your lipid or cholesterol levels are high, you are over 50, or you are at high risk for heart disease, you may need your cholesterol levels checked more frequently.Ongoing high lipid and cholesterol levels should be treated with medicines if diet and exercise are not working.  If you smoke, find out from your health care provider how to quit. If you do not use tobacco, do not start.  Lung cancer screening is recommended for adults aged 22-80 years who are at high risk for developing lung cancer because of a history of smoking. A yearly low-dose CT scan of the lungs is recommended for people who have at least a 30-pack-year history of smoking and are a current smoker or have quit within the past 15 years. A pack year of smoking is smoking an average of 1 pack of cigarettes a day for 1 year (for example: 1 pack a day for 30 years or 2 packs a day for 15 years). Yearly screening should continue until the smoker has stopped smoking for at least 15 years. Yearly screening should be stopped for people who develop a health problem that would prevent them from having lung cancer treatment.  If you are pregnant, do not drink alcohol. If you are breastfeeding,  be very cautious about drinking alcohol. If you are not pregnant and choose to drink alcohol, do not have more than 1 drink per day. One drink is considered to be 12 ounces (355 mL) of beer, 5 ounces (148 mL) of wine, or 1.5 ounces (44 mL) of liquor.  Avoid use of street drugs. Do not share needles with anyone. Ask for help if you need support or instructions about stopping the use of drugs.  High blood pressure causes heart disease and increases the risk of  stroke. Your blood pressure should be checked at least every 1 to 2 years. Ongoing high blood pressure should be treated with medicines if weight loss and exercise do not work.  If you are 75-52 years old, ask your health care provider if you should take aspirin to prevent strokes.  Diabetes screening involves taking a blood sample to check your fasting blood sugar level. This should be done once every 3 years, after age 15, if you are within normal weight and without risk factors for diabetes. Testing should be considered at a younger age or be carried out more frequently if you are overweight and have at least 1 risk factor for diabetes.  Breast cancer screening is essential preventive care for women. You should practice "breast self-awareness." This means understanding the normal appearance and feel of your breasts and may include breast self-examination. Any changes detected, no matter how small, should be reported to a health care provider. Women in their 58s and 30s should have a clinical breast exam (CBE) by a health care provider as part of a regular health exam every 1 to 3 years. After age 16, women should have a CBE every year. Starting at age 53, women should consider having a mammogram (breast X-ray test) every year. Women who have a family history of breast cancer should talk to their health care provider about genetic screening. Women at a high risk of breast cancer should talk to their health care providers about having an MRI and a mammogram every year.  Breast cancer gene (BRCA)-related cancer risk assessment is recommended for women who have family members with BRCA-related cancers. BRCA-related cancers include breast, ovarian, tubal, and peritoneal cancers. Having family members with these cancers may be associated with an increased risk for harmful changes (mutations) in the breast cancer genes BRCA1 and BRCA2. Results of the assessment will determine the need for genetic counseling and  BRCA1 and BRCA2 testing.  Routine pelvic exams to screen for cancer are no longer recommended for nonpregnant women who are considered low risk for cancer of the pelvic organs (ovaries, uterus, and vagina) and who do not have symptoms. Ask your health care provider if a screening pelvic exam is right for you.  If you have had past treatment for cervical cancer or a condition that could lead to cancer, you need Pap tests and screening for cancer for at least 20 years after your treatment. If Pap tests have been discontinued, your risk factors (such as having a new sexual partner) need to be reassessed to determine if screening should be resumed. Some women have medical problems that increase the chance of getting cervical cancer. In these cases, your health care provider may recommend more frequent screening and Pap tests.  The HPV test is an additional test that may be used for cervical cancer screening. The HPV test looks for the virus that can cause the cell changes on the cervix. The cells collected during the Pap test can be  tested for HPV. The HPV test could be used to screen women aged 30 years and older, and should be used in women of any age who have unclear Pap test results. After the age of 30, women should have HPV testing at the same frequency as a Pap test.  Colorectal cancer can be detected and often prevented. Most routine colorectal cancer screening begins at the age of 50 years and continues through age 75 years. However, your health care provider may recommend screening at an earlier age if you have risk factors for colon cancer. On a yearly basis, your health care provider may provide home test kits to check for hidden blood in the stool. Use of a small camera at the end of a tube, to directly examine the colon (sigmoidoscopy or colonoscopy), can detect the earliest forms of colorectal cancer. Talk to your health care provider about this at age 50, when routine screening begins. Direct  exam of the colon should be repeated every 5-10 years through age 75 years, unless early forms of pre-cancerous polyps or small growths are found.  People who are at an increased risk for hepatitis B should be screened for this virus. You are considered at high risk for hepatitis B if:  You were born in a country where hepatitis B occurs often. Talk with your health care provider about which countries are considered high risk.  Your parents were born in a high-risk country and you have not received a shot to protect against hepatitis B (hepatitis B vaccine).  You have HIV or AIDS.  You use needles to inject street drugs.  You live with, or have sex with, someone who has hepatitis B.  You get hemodialysis treatment.  You take certain medicines for conditions like cancer, organ transplantation, and autoimmune conditions.  Hepatitis C blood testing is recommended for all people born from 1945 through 1965 and any individual with known risks for hepatitis C.  Practice safe sex. Use condoms and avoid high-risk sexual practices to reduce the spread of sexually transmitted infections (STIs). STIs include gonorrhea, chlamydia, syphilis, trichomonas, herpes, HPV, and human immunodeficiency virus (HIV). Herpes, HIV, and HPV are viral illnesses that have no cure. They can result in disability, cancer, and death.  You should be screened for sexually transmitted illnesses (STIs) including gonorrhea and chlamydia if:  You are sexually active and are younger than 24 years.  You are older than 24 years and your health care provider tells you that you are at risk for this type of infection.  Your sexual activity has changed since you were last screened and you are at an increased risk for chlamydia or gonorrhea. Ask your health care provider if you are at risk.  If you are at risk of being infected with HIV, it is recommended that you take a prescription medicine daily to prevent HIV infection. This is  called preexposure prophylaxis (PrEP). You are considered at risk if:  You are a heterosexual woman, are sexually active, and are at increased risk for HIV infection.  You take drugs by injection.  You are sexually active with a partner who has HIV.  Talk with your health care provider about whether you are at high risk of being infected with HIV. If you choose to begin PrEP, you should first be tested for HIV. You should then be tested every 3 months for as long as you are taking PrEP.  Osteoporosis is a disease in which the bones lose minerals and strength   with aging. This can result in serious bone fractures or breaks. The risk of osteoporosis can be identified using a bone density scan. Women ages 65 years and over and women at risk for fractures or osteoporosis should discuss screening with their health care providers. Ask your health care provider whether you should take a calcium supplement or vitamin D to reduce the rate of osteoporosis.  Menopause can be associated with physical symptoms and risks. Hormone replacement therapy is available to decrease symptoms and risks. You should talk to your health care provider about whether hormone replacement therapy is right for you.  Use sunscreen. Apply sunscreen liberally and repeatedly throughout the day. You should seek shade when your shadow is shorter than you. Protect yourself by wearing long sleeves, pants, a wide-brimmed hat, and sunglasses year round, whenever you are outdoors.  Once a month, do a whole body skin exam, using a mirror to look at the skin on your back. Tell your health care provider of new moles, moles that have irregular borders, moles that are larger than a pencil eraser, or moles that have changed in shape or color.  Stay current with required vaccines (immunizations).  Influenza vaccine. All adults should be immunized every year.  Tetanus, diphtheria, and acellular pertussis (Td, Tdap) vaccine. Pregnant women should  receive 1 dose of Tdap vaccine during each pregnancy. The dose should be obtained regardless of the length of time since the last dose. Immunization is preferred during the 27th-36th week of gestation. An adult who has not previously received Tdap or who does not know her vaccine status should receive 1 dose of Tdap. This initial dose should be followed by tetanus and diphtheria toxoids (Td) booster doses every 10 years. Adults with an unknown or incomplete history of completing a 3-dose immunization series with Td-containing vaccines should begin or complete a primary immunization series including a Tdap dose. Adults should receive a Td booster every 10 years.  Varicella vaccine. An adult without evidence of immunity to varicella should receive 2 doses or a second dose if she has previously received 1 dose. Pregnant females who do not have evidence of immunity should receive the first dose after pregnancy. This first dose should be obtained before leaving the health care facility. The second dose should be obtained 4-8 weeks after the first dose.  Human papillomavirus (HPV) vaccine. Females aged 13-26 years who have not received the vaccine previously should obtain the 3-dose series. The vaccine is not recommended for use in pregnant females. However, pregnancy testing is not needed before receiving a dose. If a female is found to be pregnant after receiving a dose, no treatment is needed. In that case, the remaining doses should be delayed until after the pregnancy. Immunization is recommended for any person with an immunocompromised condition through the age of 26 years if she did not get any or all doses earlier. During the 3-dose series, the second dose should be obtained 4-8 weeks after the first dose. The third dose should be obtained 24 weeks after the first dose and 16 weeks after the second dose.  Zoster vaccine. One dose is recommended for adults aged 60 years or older unless certain conditions are  present.  Measles, mumps, and rubella (MMR) vaccine. Adults born before 1957 generally are considered immune to measles and mumps. Adults born in 1957 or later should have 1 or more doses of MMR vaccine unless there is a contraindication to the vaccine or there is laboratory evidence of immunity to   each of the three diseases. A routine second dose of MMR vaccine should be obtained at least 28 days after the first dose for students attending postsecondary schools, health care workers, or international travelers. People who received inactivated measles vaccine or an unknown type of measles vaccine during 1963-1967 should receive 2 doses of MMR vaccine. People who received inactivated mumps vaccine or an unknown type of mumps vaccine before 1979 and are at high risk for mumps infection should consider immunization with 2 doses of MMR vaccine. For females of childbearing age, rubella immunity should be determined. If there is no evidence of immunity, females who are not pregnant should be vaccinated. If there is no evidence of immunity, females who are pregnant should delay immunization until after pregnancy. Unvaccinated health care workers born before 1957 who lack laboratory evidence of measles, mumps, or rubella immunity or laboratory confirmation of disease should consider measles and mumps immunization with 2 doses of MMR vaccine or rubella immunization with 1 dose of MMR vaccine.  Pneumococcal 13-valent conjugate (PCV13) vaccine. When indicated, a person who is uncertain of her immunization history and has no record of immunization should receive the PCV13 vaccine. An adult aged 19 years or older who has certain medical conditions and has not been previously immunized should receive 1 dose of PCV13 vaccine. This PCV13 should be followed with a dose of pneumococcal polysaccharide (PPSV23) vaccine. The PPSV23 vaccine dose should be obtained at least 8 weeks after the dose of PCV13 vaccine. An adult aged 19  years or older who has certain medical conditions and previously received 1 or more doses of PPSV23 vaccine should receive 1 dose of PCV13. The PCV13 vaccine dose should be obtained 1 or more years after the last PPSV23 vaccine dose.  Pneumococcal polysaccharide (PPSV23) vaccine. When PCV13 is also indicated, PCV13 should be obtained first. All adults aged 65 years and older should be immunized. An adult younger than age 65 years who has certain medical conditions should be immunized. Any person who resides in a nursing home or long-term care facility should be immunized. An adult smoker should be immunized. People with an immunocompromised condition and certain other conditions should receive both PCV13 and PPSV23 vaccines. People with human immunodeficiency virus (HIV) infection should be immunized as soon as possible after diagnosis. Immunization during chemotherapy or radiation therapy should be avoided. Routine use of PPSV23 vaccine is not recommended for American Indians, [place] Natives, or people younger than 65 years unless there are medical conditions that require PPSV23 vaccine. When indicated, people who have unknown immunization and have no record of immunization should receive PPSV23 vaccine. One-time revaccination 5 years after the first dose of PPSV23 is recommended for people aged 19-64 years who have chronic kidney failure, nephrotic syndrome, asplenia, or immunocompromised conditions. People who received 1-2 doses of PPSV23 before age 65 years should receive another dose of PPSV23 vaccine at age 65 years or later if at least 5 years have passed since the previous dose. Doses of PPSV23 are not needed for people immunized with PPSV23 at or after age 65 years.  Meningococcal vaccine. Adults with asplenia or persistent complement component deficiencies should receive 2 doses of quadrivalent meningococcal conjugate (MenACWY-D) vaccine. The doses should be obtained at least 2 months apart.  Microbiologists working with certain meningococcal bacteria, military recruits, people at risk during an outbreak, and people who travel to or live in countries with a high rate of meningitis should be immunized. A first-year college student up through age   21 years who is living in a residence hall should receive a dose if she did not receive a dose on or after her 16th birthday. Adults who have certain high-risk conditions should receive one or more doses of vaccine.  Hepatitis A vaccine. Adults who wish to be protected from this disease, have certain high-risk conditions, work with hepatitis A-infected animals, work in hepatitis A research labs, or travel to or work in countries with a high rate of hepatitis A should be immunized. Adults who were previously unvaccinated and who anticipate close contact with an international adoptee during the first 60 days after arrival in the [place] from a country with a high rate of hepatitis A should be immunized.  Hepatitis B vaccine. Adults who wish to be protected from this disease, have certain high-risk conditions, may be exposed to blood or other infectious body fluids, are household contacts or sex partners of hepatitis B positive people, are clients or workers in certain care facilities, or travel to or work in countries with a high rate of hepatitis B should be immunized.  Haemophilus influenzae type b (Hib) vaccine. A previously unvaccinated person with asplenia or sickle cell disease or having a scheduled splenectomy should receive 1 dose of Hib vaccine. Regardless of previous immunization, a recipient of a hematopoietic stem cell transplant should receive a 3-dose series 6-12 months after her successful transplant. Hib vaccine is not recommended for adults with HIV infection. Preventive Services / Frequency Ages 64 to 68 years  Blood pressure check.** / Every 1 to 2 years.  Lipid and cholesterol check.** / Every 5 years beginning at age  22.  Clinical breast exam.** / Every 3 years for women in their 88s and 53s.  BRCA-related cancer risk assessment.** / For women who have family members with a BRCA-related cancer (breast, ovarian, tubal, or peritoneal cancers).  Pap test.** / Every 2 years from ages 90 through 51. Every 3 years starting at age 21 through age 56 or 3 with a history of 3 consecutive normal Pap tests.  HPV screening.** / Every 3 years from ages 24 through ages 1 to 46 with a history of 3 consecutive normal Pap tests.  Hepatitis C blood test.** / For any individual with known risks for hepatitis C.  Skin self-exam. / Monthly.  Influenza vaccine. / Every year.  Tetanus, diphtheria, and acellular pertussis (Tdap, Td) vaccine.** / Consult your health care provider. Pregnant women should receive 1 dose of Tdap vaccine during each pregnancy. 1 dose of Td every 10 years.  Varicella vaccine.** / Consult your health care provider. Pregnant females who do not have evidence of immunity should receive the first dose after pregnancy.  HPV vaccine. / 3 doses over 6 months, if 72 and younger. The vaccine is not recommended for use in pregnant females. However, pregnancy testing is not needed before receiving a dose.  Measles, mumps, rubella (MMR) vaccine.** / You need at least 1 dose of MMR if you were born in 1957 or later. You may also need a 2nd dose. For females of childbearing age, rubella immunity should be determined. If there is no evidence of immunity, females who are not pregnant should be vaccinated. If there is no evidence of immunity, females who are pregnant should delay immunization until after pregnancy.  Pneumococcal 13-valent conjugate (PCV13) vaccine.** / Consult your health care provider.  Pneumococcal polysaccharide (PPSV23) vaccine.** / 1 to 2 doses if you smoke cigarettes or if you have certain conditions.  Meningococcal vaccine.** /  1 dose if you are age 19 to 21 years and a first-year college  student living in a residence hall, or have one of several medical conditions, you need to get vaccinated against meningococcal disease. You may also need additional booster doses.  Hepatitis A vaccine.** / Consult your health care provider.  Hepatitis B vaccine.** / Consult your health care provider.  Haemophilus influenzae type b (Hib) vaccine.** / Consult your health care provider. Ages 40 to 64 years  Blood pressure check.** / Every 1 to 2 years.  Lipid and cholesterol check.** / Every 5 years beginning at age 20 years.  Lung cancer screening. / Every year if you are aged 55-80 years and have a 30-pack-year history of smoking and currently smoke or have quit within the past 15 years. Yearly screening is stopped once you have quit smoking for at least 15 years or develop a health problem that would prevent you from having lung cancer treatment.  Clinical breast exam.** / Every year after age 40 years.  BRCA-related cancer risk assessment.** / For women who have family members with a BRCA-related cancer (breast, ovarian, tubal, or peritoneal cancers).  Mammogram.** / Every year beginning at age 40 years and continuing for as long as you are in good health. Consult with your health care provider.  Pap test.** / Every 3 years starting at age 30 years through age 65 or 70 years with a history of 3 consecutive normal Pap tests.  HPV screening.** / Every 3 years from ages 30 years through ages 65 to 70 years with a history of 3 consecutive normal Pap tests.  Fecal occult blood test (FOBT) of stool. / Every year beginning at age 50 years and continuing until age 75 years. You may not need to do this test if you get a colonoscopy every 10 years.  Flexible sigmoidoscopy or colonoscopy.** / Every 5 years for a flexible sigmoidoscopy or every 10 years for a colonoscopy beginning at age 50 years and continuing until age 75 years.  Hepatitis C blood test.** / For all people born from 1945 through  1965 and any individual with known risks for hepatitis C.  Skin self-exam. / Monthly.  Influenza vaccine. / Every year.  Tetanus, diphtheria, and acellular pertussis (Tdap/Td) vaccine.** / Consult your health care provider. Pregnant women should receive 1 dose of Tdap vaccine during each pregnancy. 1 dose of Td every 10 years.  Varicella vaccine.** / Consult your health care provider. Pregnant females who do not have evidence of immunity should receive the first dose after pregnancy.  Zoster vaccine.** / 1 dose for adults aged 60 years or older.  Measles, mumps, rubella (MMR) vaccine.** / You need at least 1 dose of MMR if you were born in 1957 or later. You may also need a 2nd dose. For females of childbearing age, rubella immunity should be determined. If there is no evidence of immunity, females who are not pregnant should be vaccinated. If there is no evidence of immunity, females who are pregnant should delay immunization until after pregnancy.  Pneumococcal 13-valent conjugate (PCV13) vaccine.** / Consult your health care provider.  Pneumococcal polysaccharide (PPSV23) vaccine.** / 1 to 2 doses if you smoke cigarettes or if you have certain conditions.  Meningococcal vaccine.** / Consult your health care provider.  Hepatitis A vaccine.** / Consult your health care provider.  Hepatitis B vaccine.** / Consult your health care provider.  Haemophilus influenzae type b (Hib) vaccine.** / Consult your health care provider. Ages 65   years and over  Blood pressure check.** / Every 1 to 2 years.  Lipid and cholesterol check.** / Every 5 years beginning at age 22 years.  Lung cancer screening. / Every year if you are aged 73-80 years and have a 30-pack-year history of smoking and currently smoke or have quit within the past 15 years. Yearly screening is stopped once you have quit smoking for at least 15 years or develop a health problem that would prevent you from having lung cancer  treatment.  Clinical breast exam.** / Every year after age 4 years.  BRCA-related cancer risk assessment.** / For women who have family members with a BRCA-related cancer (breast, ovarian, tubal, or peritoneal cancers).  Mammogram.** / Every year beginning at age 40 years and continuing for as long as you are in good health. Consult with your health care provider.  Pap test.** / Every 3 years starting at age 9 years through age 34 or 91 years with 3 consecutive normal Pap tests. Testing can be stopped between 65 and 70 years with 3 consecutive normal Pap tests and no abnormal Pap or HPV tests in the past 10 years.  HPV screening.** / Every 3 years from ages 57 years through ages 64 or 45 years with a history of 3 consecutive normal Pap tests. Testing can be stopped between 65 and 70 years with 3 consecutive normal Pap tests and no abnormal Pap or HPV tests in the past 10 years.  Fecal occult blood test (FOBT) of stool. / Every year beginning at age 15 years and continuing until age 17 years. You may not need to do this test if you get a colonoscopy every 10 years.  Flexible sigmoidoscopy or colonoscopy.** / Every 5 years for a flexible sigmoidoscopy or every 10 years for a colonoscopy beginning at age 86 years and continuing until age 71 years.  Hepatitis C blood test.** / For all people born from [birth year] through 1965 and any individual with known risks for hepatitis C.  Osteoporosis screening.** / A one-time screening for women ages 83 years and over and women at risk for fractures or osteoporosis.  Skin self-exam. / Monthly.  Influenza vaccine. / Every year.  Tetanus, diphtheria, and acellular pertussis (Tdap/Td) vaccine.** / 1 dose of Td every 10 years.  Varicella vaccine.** / Consult your health care provider.  Zoster vaccine.** / 1 dose for adults aged 61 years or older.  Pneumococcal 13-valent conjugate (PCV13) vaccine.** / Consult your health care provider.  Pneumococcal  polysaccharide (PPSV23) vaccine.** / 1 dose for all adults aged 28 years and older.  Meningococcal vaccine.** / Consult your health care provider.  Hepatitis A vaccine.** / Consult your health care provider.  Hepatitis B vaccine.** / Consult your health care provider.  Haemophilus influenzae type b (Hib) vaccine.** / Consult your health care provider. ** Family history and personal history of risk and conditions may change your health care provider's recommendations. Document Released: 10/27/2001 Document Revised: 01/15/2014 Document Reviewed: 01/26/2011 [facility] Patient Information 2015 [place], [place]. This information is not intended to replace advice given to you by your health care provider. Make sure you discuss any questions you have with your health care provider.

## 2015-03-22 ENCOUNTER — Other Ambulatory Visit: Payer: BLUE CROSS/BLUE SHIELD

## 2015-03-25 ENCOUNTER — Ambulatory Visit
Admission: RE | Admit: 2015-03-25 | Discharge: 2015-03-25 | Disposition: A | Discharge status: Home / Self Care | Payer: BLUE CROSS/BLUE SHIELD | Source: Ambulatory Visit

## 2015-03-25 DIAGNOSIS — Z1231 Encounter for screening mammogram for malignant neoplasm of breast: Secondary | ICD-10-CM

## 2015-03-27 ENCOUNTER — Other Ambulatory Visit (INDEPENDENT_AMBULATORY_CARE_PROVIDER_SITE_OTHER): Payer: BLUE CROSS/BLUE SHIELD

## 2015-03-27 DIAGNOSIS — I10 Essential (primary) hypertension: Secondary | ICD-10-CM

## 2015-03-27 DIAGNOSIS — Z0000 Encounter for general adult medical examination without abnormal findings: Secondary | ICD-10-CM

## 2015-03-27 LAB — MICROALBUMIN / CREATININE URINE RATIO
CREATININE, U: 267.4 mg/dL
MICROALB/CREAT RATIO: 0.6 mg/g (ref 0.0–30.0)
Microalb, Ur: 1.6 mg/dL (ref 0.0–1.9)

## 2015-03-27 LAB — CBC WITH DIFFERENTIAL/PLATELET
Basophils Absolute: 0 10*3/uL (ref 0.0–0.1)
Basophils Relative: 0.6 % (ref 0.0–3.0)
EOS ABS: 0.1 10*3/uL (ref 0.0–0.7)
Eosinophils Relative: 2 % (ref 0.0–5.0)
HCT: 39.5 % (ref 36.0–46.0)
Hemoglobin: 12.5 g/dL (ref 12.0–15.0)
Lymphocytes Relative: 28.9 % (ref 12.0–46.0)
Lymphs Abs: 1.5 10*3/uL (ref 0.7–4.0)
MCHC: 31.7 g/dL (ref 30.0–36.0)
MCV: 64.8 fl — ABNORMAL LOW (ref 78.0–100.0)
MONO ABS: 0.2 10*3/uL (ref 0.1–1.0)
Monocytes Relative: 4.2 % (ref 3.0–12.0)
NEUTROS PCT: 64.3 % (ref 43.0–77.0)
Neutro Abs: 3.4 10*3/uL (ref 1.4–7.7)
PLATELETS: 259 10*3/uL (ref 150.0–400.0)
RBC: 6.1 Mil/uL — ABNORMAL HIGH (ref 3.87–5.11)
RDW: 15.8 % — AB (ref 11.5–15.5)
WBC: 5.3 10*3/uL (ref 4.0–10.5)

## 2015-03-27 LAB — LIPID PANEL
CHOLESTEROL: 151 mg/dL (ref 0–200)
HDL: 45.3 mg/dL (ref >39.00)
LDL CALC: 89 mg/dL (ref 0–99)
NonHDL: 105.7
Total CHOL/HDL Ratio: 3
Triglycerides: 82 mg/dL (ref 0.0–149.0)
VLDL: 16.4 mg/dL (ref 0.0–40.0)

## 2015-03-27 LAB — POCT URINALYSIS DIPSTICK
Bilirubin, UA: NEGATIVE
Blood, UA: NEGATIVE
GLUCOSE UA: NEGATIVE
Ketones, UA: NEGATIVE
Leukocytes, UA: NEGATIVE
Nitrite, UA: NEGATIVE
PH UA: 6
PROTEIN UA: NEGATIVE
SPEC GRAV UA: 1.025
Urobilinogen, UA: 0.2

## 2015-03-27 LAB — HEPATIC FUNCTION PANEL
ALBUMIN: 4 g/dL (ref 3.5–5.2)
ALT: 16 U/L (ref 0–35)
AST: 12 U/L (ref 0–37)
Alkaline Phosphatase: 53 U/L (ref 39–117)
BILIRUBIN DIRECT: 0.2 mg/dL (ref 0.0–0.3)
Total Bilirubin: 1.2 mg/dL (ref 0.2–1.2)
Total Protein: 6.7 g/dL (ref 6.0–8.3)

## 2015-03-27 LAB — BASIC METABOLIC PANEL
BUN: 13 mg/dL (ref 6–23)
CHLORIDE: 101 meq/L (ref 96–112)
CO2: 31 mEq/L (ref 19–32)
CREATININE: 1.01 mg/dL (ref 0.40–1.20)
Calcium: 9.2 mg/dL (ref 8.4–10.5)
GFR: 59.66 mL/min — AB (ref >60.00)
Glucose, Bld: 163 mg/dL — ABNORMAL HIGH (ref 70–99)
Potassium: 3.6 mEq/L (ref 3.5–5.1)
Sodium: 140 mEq/L (ref 135–145)

## 2015-03-27 LAB — TSH: TSH: 2.99 u[IU]/mL (ref 0.35–4.50)

## 2016-02-29 ENCOUNTER — Other Ambulatory Visit: Payer: Self-pay | Admitting: Family Medicine

## 2016-03-26 ENCOUNTER — Telehealth: Payer: Self-pay | Admitting: Family Medicine

## 2016-03-26 DIAGNOSIS — I10 Essential (primary) hypertension: Secondary | ICD-10-CM

## 2016-03-26 MED ORDER — TRIAMTERENE-HCTZ 37.5-25 MG PO TABS: 1.0000 | ORAL_TABLET | Freq: Every day | ORAL | Status: DC

## 2016-03-26 NOTE — Telephone Encounter (Signed)
°  Relationship to patient: Self  Can be reached: [phone]  [facility]: [phone] [place] - [id number] AT [facility] [address] [facility] [address] [phone] (Phone) [phone] (Fax)        Reason for call:: Patient is requesting enough triamterene-hydrochlorothiazide (MAXZIDE-25) 37.5-25 MG per tablet  To last until her appointment September 25.

## 2016-05-28 ENCOUNTER — Other Ambulatory Visit: Payer: Self-pay | Admitting: Family Medicine

## 2016-05-28 DIAGNOSIS — I10 Essential (primary) hypertension: Secondary | ICD-10-CM

## 2016-06-08 ENCOUNTER — Other Ambulatory Visit: Payer: Self-pay | Admitting: Family Medicine

## 2016-06-08 ENCOUNTER — Encounter: Payer: Self-pay | Admitting: Family Medicine

## 2016-06-08 ENCOUNTER — Ambulatory Visit (INDEPENDENT_AMBULATORY_CARE_PROVIDER_SITE_OTHER): Payer: BLUE CROSS/BLUE SHIELD | Admitting: Family Medicine

## 2016-06-08 VITALS — BP 136/84 | HR 77 | Temp 98.3°F | Resp 17 | Ht 66.0 in | Wt 264.4 lb

## 2016-06-08 DIAGNOSIS — I10 Essential (primary) hypertension: Secondary | ICD-10-CM

## 2016-06-08 DIAGNOSIS — Z0000 Encounter for general adult medical examination without abnormal findings: Secondary | ICD-10-CM

## 2016-06-08 DIAGNOSIS — Z23 Encounter for immunization: Secondary | ICD-10-CM | POA: Diagnosis not present

## 2016-06-08 DIAGNOSIS — Z1231 Encounter for screening mammogram for malignant neoplasm of breast: Secondary | ICD-10-CM

## 2016-06-08 MED ORDER — TRIAMTERENE-HCTZ 37.5-25 MG PO TABS: 1.0000 | ORAL_TABLET | Freq: Every day | ORAL | 3 refills | Status: DC

## 2016-06-08 MED ORDER — PERINDOPRIL ERBUMINE 4 MG PO TABS: ORAL_TABLET | ORAL | 3 refills | Status: DC

## 2016-06-08 NOTE — Progress Notes (Signed)
Pre visit review using our clinic review tool, if applicable. No additional management support is needed unless otherwise documented below in the visit note. 

## 2016-06-08 NOTE — Patient Instructions (Signed)
Preventive Care for Adults, Female A healthy lifestyle and preventive care can promote health and wellness. Preventive health guidelines for women include the following key practices.  A routine yearly physical is a good way to check with your health care provider about your health and preventive screening. It is a chance to share any concerns and updates on your health and to receive a thorough exam.  Visit your dentist for a routine exam and preventive care every 6 months. Brush your teeth twice a day and floss once a day. Good oral hygiene prevents tooth decay and gum disease.  The frequency of eye exams is based on your age, health, family medical history, use of contact lenses, and other factors. Follow your health care provider's recommendations for frequency of eye exams.  Eat a healthy diet. Foods like vegetables, fruits, whole grains, low-fat dairy products, and lean protein foods contain the nutrients you need without too many calories. Decrease your intake of foods high in solid fats, added sugars, and salt. Eat the right amount of calories for you.Get information about a proper diet from your health care provider, if necessary.  Regular physical exercise is one of the most important things you can do for your health. Most adults should get at least 150 minutes of moderate-intensity exercise (any activity that increases your heart rate and causes you to sweat) each week. In addition, most adults need muscle-strengthening exercises on 2 or more days a week.  Maintain a healthy weight. The body mass index (BMI) is a screening tool to identify possible weight problems. It provides an estimate of body fat based on height and weight. Your health care provider can find your BMI and can help you achieve or maintain a healthy weight.For adults 20 years and older:  A BMI below 18.5 is considered underweight.  A BMI of 18.5 to 24.9 is normal.  A BMI of 25 to 29.9 is considered overweight.  A  BMI of 30 and above is considered obese.  Maintain normal blood lipids and cholesterol levels by exercising and minimizing your intake of saturated fat. Eat a balanced diet with plenty of fruit and vegetables. Blood tests for lipids and cholesterol should begin at age 45 and be repeated every 5 years. If your lipid or cholesterol levels are high, you are over 50, or you are at high risk for heart disease, you may need your cholesterol levels checked more frequently.Ongoing high lipid and cholesterol levels should be treated with medicines if diet and exercise are not working.  If you smoke, find out from your health care provider how to quit. If you do not use tobacco, do not start.  Lung cancer screening is recommended for adults aged 45-80 years who are at high risk for developing lung cancer because of a history of smoking. A yearly low-dose CT scan of the lungs is recommended for people who have at least a 30-pack-year history of smoking and are a current smoker or have quit within the past 15 years. A pack year of smoking is smoking an average of 1 pack of cigarettes a day for 1 year (for example: 1 pack a day for 30 years or 2 packs a day for 15 years). Yearly screening should continue until the smoker has stopped smoking for at least 15 years. Yearly screening should be stopped for people who develop a health problem that would prevent them from having lung cancer treatment.  If you are pregnant, do not drink alcohol. If you are  breastfeeding, be very cautious about drinking alcohol. If you are not pregnant and choose to drink alcohol, do not have more than 1 drink per day. One drink is considered to be 12 ounces (355 mL) of beer, 5 ounces (148 mL) of wine, or 1.5 ounces (44 mL) of liquor.  Avoid use of street drugs. Do not share needles with anyone. Ask for help if you need support or instructions about stopping the use of drugs.  High blood pressure causes heart disease and increases the risk  of stroke. Your blood pressure should be checked at least every 1 to 2 years. Ongoing high blood pressure should be treated with medicines if weight loss and exercise do not work.  If you are 55-79 years old, ask your health care provider if you should take aspirin to prevent strokes.  Diabetes screening is done by taking a blood sample to check your blood glucose level after you have not eaten for a certain period of time (fasting). If you are not overweight and you do not have risk factors for diabetes, you should be screened once every 3 years starting at age 45. If you are overweight or obese and you are 40-70 years of age, you should be screened for diabetes every year as part of your cardiovascular risk assessment.  Breast cancer screening is essential preventive care for women. You should practice "breast self-awareness." This means understanding the normal appearance and feel of your breasts and may include breast self-examination. Any changes detected, no matter how small, should be reported to a health care provider. Women in their 20s and 30s should have a clinical breast exam (CBE) by a health care provider as part of a regular health exam every 1 to 3 years. After age 40, women should have a CBE every year. Starting at age 40, women should consider having a mammogram (breast X-ray test) every year. Women who have a family history of breast cancer should talk to their health care provider about genetic screening. Women at a high risk of breast cancer should talk to their health care providers about having an MRI and a mammogram every year.  Breast cancer gene (BRCA)-related cancer risk assessment is recommended for women who have family members with BRCA-related cancers. BRCA-related cancers include breast, ovarian, tubal, and peritoneal cancers. Having family members with these cancers may be associated with an increased risk for harmful changes (mutations) in the breast cancer genes BRCA1 and  BRCA2. Results of the assessment will determine the need for genetic counseling and BRCA1 and BRCA2 testing.  Your health care provider may recommend that you be screened regularly for cancer of the pelvic organs (ovaries, uterus, and vagina). This screening involves a pelvic examination, including checking for microscopic changes to the surface of your cervix (Pap test). You may be encouraged to have this screening done every 3 years, beginning at age 21.  For women ages 30-65, health care providers may recommend pelvic exams and Pap testing every 3 years, or they may recommend the Pap and pelvic exam, combined with testing for human papilloma virus (HPV), every 5 years. Some types of HPV increase your risk of cervical cancer. Testing for HPV may also be done on women of any age with unclear Pap test results.  Other health care providers may not recommend any screening for nonpregnant women who are considered low risk for pelvic cancer and who do not have symptoms. Ask your health care provider if a screening pelvic exam is right for   you.  If you have had past treatment for cervical cancer or a condition that could lead to cancer, you need Pap tests and screening for cancer for at least 20 years after your treatment. If Pap tests have been discontinued, your risk factors (such as having a new sexual partner) need to be reassessed to determine if screening should resume. Some women have medical problems that increase the chance of getting cervical cancer. In these cases, your health care provider may recommend more frequent screening and Pap tests.  Colorectal cancer can be detected and often prevented. Most routine colorectal cancer screening begins at the age of 50 years and continues through age 75 years. However, your health care provider may recommend screening at an earlier age if you have risk factors for colon cancer. On a yearly basis, your health care provider may provide home test kits to check  for hidden blood in the stool. Use of a small camera at the end of a tube, to directly examine the colon (sigmoidoscopy or colonoscopy), can detect the earliest forms of colorectal cancer. Talk to your health care provider about this at age 50, when routine screening begins. Direct exam of the colon should be repeated every 5-10 years through age 75 years, unless early forms of precancerous polyps or small growths are found.  People who are at an increased risk for hepatitis B should be screened for this virus. You are considered at high risk for hepatitis B if:  You were born in a country where hepatitis B occurs often. Talk with your health care provider about which countries are considered high risk.  Your parents were born in a high-risk country and you have not received a shot to protect against hepatitis B (hepatitis B vaccine).  You have HIV or AIDS.  You use needles to inject street drugs.  You live with, or have sex with, someone who has hepatitis B.  You get hemodialysis treatment.  You take certain medicines for conditions like cancer, organ transplantation, and autoimmune conditions.  Hepatitis C blood testing is recommended for all people born from 1945 through 1965 and any individual with known risks for hepatitis C.  Practice safe sex. Use condoms and avoid high-risk sexual practices to reduce the spread of sexually transmitted infections (STIs). STIs include gonorrhea, chlamydia, syphilis, trichomonas, herpes, HPV, and human immunodeficiency virus (HIV). Herpes, HIV, and HPV are viral illnesses that have no cure. They can result in disability, cancer, and death.  You should be screened for sexually transmitted illnesses (STIs) including gonorrhea and chlamydia if:  You are sexually active and are younger than 24 years.  You are older than 24 years and your health care provider tells you that you are at risk for this type of infection.  Your sexual activity has changed  since you were last screened and you are at an increased risk for chlamydia or gonorrhea. Ask your health care provider if you are at risk.  If you are at risk of being infected with HIV, it is recommended that you take a prescription medicine daily to prevent HIV infection. This is called preexposure prophylaxis (PrEP). You are considered at risk if:  You are sexually active and do not regularly use condoms or know the HIV status of your partner(s).  You take drugs by injection.  You are sexually active with a partner who has HIV.  Talk with your health care provider about whether you are at high risk of being infected with HIV. If   you choose to begin PrEP, you should first be tested for HIV. You should then be tested every 3 months for as long as you are taking PrEP.  Osteoporosis is a disease in which the bones lose minerals and strength with aging. This can result in serious bone fractures or breaks. The risk of osteoporosis can be identified using a bone density scan. Women ages 67 years and over and women at risk for fractures or osteoporosis should discuss screening with their health care providers. Ask your health care provider whether you should take a calcium supplement or vitamin D to reduce the rate of osteoporosis.  Menopause can be associated with physical symptoms and risks. Hormone replacement therapy is available to decrease symptoms and risks. You should talk to your health care provider about whether hormone replacement therapy is right for you.  Use sunscreen. Apply sunscreen liberally and repeatedly throughout the day. You should seek shade when your shadow is shorter than you. Protect yourself by wearing long sleeves, pants, a wide-brimmed hat, and sunglasses year round, whenever you are outdoors.  Once a month, do a whole body skin exam, using a mirror to look at the skin on your back. Tell your health care provider of new moles, moles that have irregular borders, moles that  are larger than a pencil eraser, or moles that have changed in shape or color.  Stay current with required vaccines (immunizations).  Influenza vaccine. All adults should be immunized every year.  Tetanus, diphtheria, and acellular pertussis (Td, Tdap) vaccine. Pregnant women should receive 1 dose of Tdap vaccine during each pregnancy. The dose should be obtained regardless of the length of time since the last dose. Immunization is preferred during the 27th-36th week of gestation. An adult who has not previously received Tdap or who does not know her vaccine status should receive 1 dose of Tdap. This initial dose should be followed by tetanus and diphtheria toxoids (Td) booster doses every 10 years. Adults with an unknown or incomplete history of completing a 3-dose immunization series with Td-containing vaccines should begin or complete a primary immunization series including a Tdap dose. Adults should receive a Td booster every 10 years.  Varicella vaccine. An adult without evidence of immunity to varicella should receive 2 doses or a second dose if she has previously received 1 dose. Pregnant females who do not have evidence of immunity should receive the first dose after pregnancy. This first dose should be obtained before leaving the health care facility. The second dose should be obtained 4-8 weeks after the first dose.  Human papillomavirus (HPV) vaccine. Females aged 13-26 years who have not received the vaccine previously should obtain the 3-dose series. The vaccine is not recommended for use in pregnant females. However, pregnancy testing is not needed before receiving a dose. If a female is found to be pregnant after receiving a dose, no treatment is needed. In that case, the remaining doses should be delayed until after the pregnancy. Immunization is recommended for any person with an immunocompromised condition through the age of 61 years if she did not get any or all doses earlier. During the  3-dose series, the second dose should be obtained 4-8 weeks after the first dose. The third dose should be obtained 24 weeks after the first dose and 16 weeks after the second dose.  Zoster vaccine. One dose is recommended for adults aged 30 years or older unless certain conditions are present.  Measles, mumps, and rubella (MMR) vaccine. Adults born  before 1957 generally are considered immune to measles and mumps. Adults born in 1957 or later should have 1 or more doses of MMR vaccine unless there is a contraindication to the vaccine or there is laboratory evidence of immunity to each of the three diseases. A routine second dose of MMR vaccine should be obtained at least 28 days after the first dose for students attending postsecondary schools, health care workers, or international travelers. People who received inactivated measles vaccine or an unknown type of measles vaccine during 1963-1967 should receive 2 doses of MMR vaccine. People who received inactivated mumps vaccine or an unknown type of mumps vaccine before 1979 and are at high risk for mumps infection should consider immunization with 2 doses of MMR vaccine. For females of childbearing age, rubella immunity should be determined. If there is no evidence of immunity, females who are not pregnant should be vaccinated. If there is no evidence of immunity, females who are pregnant should delay immunization until after pregnancy. Unvaccinated health care workers born before 1957 who lack laboratory evidence of measles, mumps, or rubella immunity or laboratory confirmation of disease should consider measles and mumps immunization with 2 doses of MMR vaccine or rubella immunization with 1 dose of MMR vaccine.  Pneumococcal 13-valent conjugate (PCV13) vaccine. When indicated, a person who is uncertain of his immunization history and has no record of immunization should receive the PCV13 vaccine. All adults 65 years of age and older should receive this  vaccine. An adult aged 19 years or older who has certain medical conditions and has not been previously immunized should receive 1 dose of PCV13 vaccine. This PCV13 should be followed with a dose of pneumococcal polysaccharide (PPSV23) vaccine. Adults who are at high risk for pneumococcal disease should obtain the PPSV23 vaccine at least 8 weeks after the dose of PCV13 vaccine. Adults older than 60 years of age who have normal immune system function should obtain the PPSV23 vaccine dose at least 1 year after the dose of PCV13 vaccine.  Pneumococcal polysaccharide (PPSV23) vaccine. When PCV13 is also indicated, PCV13 should be obtained first. All adults aged 65 years and older should be immunized. An adult younger than age 65 years who has certain medical conditions should be immunized. Any person who resides in a nursing home or long-term care facility should be immunized. An adult smoker should be immunized. People with an immunocompromised condition and certain other conditions should receive both PCV13 and PPSV23 vaccines. People with human immunodeficiency virus (HIV) infection should be immunized as soon as possible after diagnosis. Immunization during chemotherapy or radiation therapy should be avoided. Routine use of PPSV23 vaccine is not recommended for American Indians, [place] Natives, or people younger than 65 years unless there are medical conditions that require PPSV23 vaccine. When indicated, people who have unknown immunization and have no record of immunization should receive PPSV23 vaccine. One-time revaccination 5 years after the first dose of PPSV23 is recommended for people aged 19-64 years who have chronic kidney failure, nephrotic syndrome, asplenia, or immunocompromised conditions. People who received 1-2 doses of PPSV23 before age 65 years should receive another dose of PPSV23 vaccine at age 65 years or later if at least 5 years have passed since the previous dose. Doses of PPSV23 are not  needed for people immunized with PPSV23 at or after age 65 years.  Meningococcal vaccine. Adults with asplenia or persistent complement component deficiencies should receive 2 doses of quadrivalent meningococcal conjugate (MenACWY-D) vaccine. The doses should be obtained   at least 2 months apart. Microbiologists working with certain meningococcal bacteria, [place] recruits, people at risk during an outbreak, and people who travel to or live in countries with a high rate of meningitis should be immunized. A first-year college student up through age 34 years who is living in a residence hall should receive a dose if she did not receive a dose on or after her 16th birthday. Adults who have certain high-risk conditions should receive one or more doses of vaccine.  Hepatitis A vaccine. Adults who wish to be protected from this disease, have certain high-risk conditions, work with hepatitis A-infected animals, work in hepatitis A research labs, or travel to or work in countries with a high rate of hepatitis A should be immunized. Adults who were previously unvaccinated and who anticipate close contact with an international adoptee during the first 60 days after arrival in the [place] from a country with a high rate of hepatitis A should be immunized.  Hepatitis B vaccine. Adults who wish to be protected from this disease, have certain high-risk conditions, may be exposed to blood or other infectious body fluids, are household contacts or sex partners of hepatitis B positive people, are clients or workers in certain care facilities, or travel to or work in countries with a high rate of hepatitis B should be immunized.  Haemophilus influenzae type b (Hib) vaccine. A previously unvaccinated person with asplenia or sickle cell disease or having a scheduled splenectomy should receive 1 dose of Hib vaccine. Regardless of previous immunization, a recipient of a hematopoietic stem cell transplant should receive a  3-dose series 6-12 months after her successful transplant. Hib vaccine is not recommended for adults with HIV infection. Preventive Services / Frequency Ages 35 to 4 years  Blood pressure check.** / Every 3-5 years.  Lipid and cholesterol check.** / Every 5 years beginning at age 60.  Clinical breast exam.** / Every 3 years for women in their 71s and 10s.  BRCA-related cancer risk assessment.** / For women who have family members with a BRCA-related cancer (breast, ovarian, tubal, or peritoneal cancers).  Pap test.** / Every 2 years from ages 76 through 26. Every 3 years starting at age 61 through age 76 or 93 with a history of 3 consecutive normal Pap tests.  HPV screening.** / Every 3 years from ages 37 through ages 60 to 51 with a history of 3 consecutive normal Pap tests.  Hepatitis C blood test.** / For any individual with known risks for hepatitis C.  Skin self-exam. / Monthly.  Influenza vaccine. / Every year.  Tetanus, diphtheria, and acellular pertussis (Tdap, Td) vaccine.** / Consult your health care provider. Pregnant women should receive 1 dose of Tdap vaccine during each pregnancy. 1 dose of Td every 10 years.  Varicella vaccine.** / Consult your health care provider. Pregnant females who do not have evidence of immunity should receive the first dose after pregnancy.  HPV vaccine. / 3 doses over 6 months, if 93 and younger. The vaccine is not recommended for use in pregnant females. However, pregnancy testing is not needed before receiving a dose.  Measles, mumps, rubella (MMR) vaccine.** / You need at least 1 dose of MMR if you were born in 1957 or later. You may also need a 2nd dose. For females of childbearing age, rubella immunity should be determined. If there is no evidence of immunity, females who are not pregnant should be vaccinated. If there is no evidence of immunity, females who are  pregnant should delay immunization until after pregnancy.  Pneumococcal  13-valent conjugate (PCV13) vaccine.** / Consult your health care provider.  Pneumococcal polysaccharide (PPSV23) vaccine.** / 1 to 2 doses if you smoke cigarettes or if you have certain conditions.  Meningococcal vaccine.** / 1 dose if you are age 68 to 8 years and a Market researcher living in a residence hall, or have one of several medical conditions, you need to get vaccinated against meningococcal disease. You may also need additional booster doses.  Hepatitis A vaccine.** / Consult your health care provider.  Hepatitis B vaccine.** / Consult your health care provider.  Haemophilus influenzae type b (Hib) vaccine.** / Consult your health care provider. Ages 7 to 53 years  Blood pressure check.** / Every year.  Lipid and cholesterol check.** / Every 5 years beginning at age 25 years.  Lung cancer screening. / Every year if you are aged 11-80 years and have a 30-pack-year history of smoking and currently smoke or have quit within the past 15 years. Yearly screening is stopped once you have quit smoking for at least 15 years or develop a health problem that would prevent you from having lung cancer treatment.  Clinical breast exam.** / Every year after age 48 years.  BRCA-related cancer risk assessment.** / For women who have family members with a BRCA-related cancer (breast, ovarian, tubal, or peritoneal cancers).  Mammogram.** / Every year beginning at age 41 years and continuing for as long as you are in good health. Consult with your health care provider.  Pap test.** / Every 3 years starting at age 65 years through age 37 or 70 years with a history of 3 consecutive normal Pap tests.  HPV screening.** / Every 3 years from ages 72 years through ages 60 to 40 years with a history of 3 consecutive normal Pap tests.  Fecal occult blood test (FOBT) of stool. / Every year beginning at age 21 years and continuing until age 5 years. You may not need to do this test if you get  a colonoscopy every 10 years.  Flexible sigmoidoscopy or colonoscopy.** / Every 5 years for a flexible sigmoidoscopy or every 10 years for a colonoscopy beginning at age 35 years and continuing until age 48 years.  Hepatitis C blood test.** / For all people born from [birth year] through 1965 and any individual with known risks for hepatitis C.  Skin self-exam. / Monthly.  Influenza vaccine. / Every year.  Tetanus, diphtheria, and acellular pertussis (Tdap/Td) vaccine.** / Consult your health care provider. Pregnant women should receive 1 dose of Tdap vaccine during each pregnancy. 1 dose of Td every 10 years.  Varicella vaccine.** / Consult your health care provider. Pregnant females who do not have evidence of immunity should receive the first dose after pregnancy.  Zoster vaccine.** / 1 dose for adults aged 30 years or older.  Measles, mumps, rubella (MMR) vaccine.** / You need at least 1 dose of MMR if you were born in 1957 or later. You may also need a second dose. For females of childbearing age, rubella immunity should be determined. If there is no evidence of immunity, females who are not pregnant should be vaccinated. If there is no evidence of immunity, females who are pregnant should delay immunization until after pregnancy.  Pneumococcal 13-valent conjugate (PCV13) vaccine.** / Consult your health care provider.  Pneumococcal polysaccharide (PPSV23) vaccine.** / 1 to 2 doses if you smoke cigarettes or if you have certain conditions.  Meningococcal vaccine.** /  Consult your health care provider.  Hepatitis A vaccine.** / Consult your health care provider.  Hepatitis B vaccine.** / Consult your health care provider.  Haemophilus influenzae type b (Hib) vaccine.** / Consult your health care provider. Ages 64 years and over  Blood pressure check.** / Every year.  Lipid and cholesterol check.** / Every 5 years beginning at age 23 years.  Lung cancer screening. / Every year if you  are aged 16-80 years and have a 30-pack-year history of smoking and currently smoke or have quit within the past 15 years. Yearly screening is stopped once you have quit smoking for at least 15 years or develop a health problem that would prevent you from having lung cancer treatment.  Clinical breast exam.** / Every year after age 74 years.  BRCA-related cancer risk assessment.** / For women who have family members with a BRCA-related cancer (breast, ovarian, tubal, or peritoneal cancers).  Mammogram.** / Every year beginning at age 44 years and continuing for as long as you are in good health. Consult with your health care provider.  Pap test.** / Every 3 years starting at age 58 years through age 22 or 39 years with 3 consecutive normal Pap tests. Testing can be stopped between 65 and 70 years with 3 consecutive normal Pap tests and no abnormal Pap or HPV tests in the past 10 years.  HPV screening.** / Every 3 years from ages 64 years through ages 70 or 61 years with a history of 3 consecutive normal Pap tests. Testing can be stopped between 65 and 70 years with 3 consecutive normal Pap tests and no abnormal Pap or HPV tests in the past 10 years.  Fecal occult blood test (FOBT) of stool. / Every year beginning at age 40 years and continuing until age 27 years. You may not need to do this test if you get a colonoscopy every 10 years.  Flexible sigmoidoscopy or colonoscopy.** / Every 5 years for a flexible sigmoidoscopy or every 10 years for a colonoscopy beginning at age 7 years and continuing until age 32 years.  Hepatitis C blood test.** / For all people born from [birth year] through 1965 and any individual with known risks for hepatitis C.  Osteoporosis screening.** / A one-time screening for women ages 30 years and over and women at risk for fractures or osteoporosis.  Skin self-exam. / Monthly.  Influenza vaccine. / Every year.  Tetanus, diphtheria, and acellular pertussis (Tdap/Td)  vaccine.** / 1 dose of Td every 10 years.  Varicella vaccine.** / Consult your health care provider.  Zoster vaccine.** / 1 dose for adults aged 35 years or older.  Pneumococcal 13-valent conjugate (PCV13) vaccine.** / Consult your health care provider.  Pneumococcal polysaccharide (PPSV23) vaccine.** / 1 dose for all adults aged 46 years and older.  Meningococcal vaccine.** / Consult your health care provider.  Hepatitis A vaccine.** / Consult your health care provider.  Hepatitis B vaccine.** / Consult your health care provider.  Haemophilus influenzae type b (Hib) vaccine.** / Consult your health care provider. ** Family history and personal history of risk and conditions may change your health care provider's recommendations.   This information is not intended to replace advice given to you by your health care provider. Make sure you discuss any questions you have with your health care provider.   Document Released: 10/27/2001 Document Revised: 09/21/2014 Document Reviewed: 01/26/2011 Elsevier Interactive Patient Education [employer].

## 2016-06-08 NOTE — Progress Notes (Signed)
Subjective:     Marissa Leblanc is a 60 y.o. female and is here for a comprehensive physical exam. The patient reports no problems.  Social History   Social History  . Marital status: Divorced    Spouse name: N/A  . Number of children: N/A  . Years of education: N/A   Occupational History  . [employer] Marissa Leblanc   Social History Main Topics  . Smoking status: Never Smoker  . Smokeless tobacco: Never Used  . Alcohol use 0.0 oz/week     Comment: rare  . Drug use: No  . Sexual activity: Not Currently    Partners: Male   Other Topics Concern  . Not on file   Social History Narrative   Exercise-- no---but she will start   Health Maintenance  Topic Date Due  . Hepatitis C Screening  [birth year]  . HIV Screening  05/30/1971  . INFLUENZA VACCINE  04/14/2016  . ZOSTAVAX  05/29/2016  . COLONOSCOPY  06/01/2016  . TETANUS/TDAP  07/12/2016  . PAP SMEAR  02/07/2017  . MAMMOGRAM  03/24/2017    The following portions of the patient's history were reviewed and updated as appropriate:  She  has a past medical history of Hypertension. She  does not have any pertinent problems on file. She  has a past surgical history that includes Tear duct probing. Her family history includes Angina in her maternal grandmother; Diabetes in her sister; Heart attack in her maternal grandmother; Hypertension in her father, mother, and sister; Stroke in her father. She  reports that she has never smoked. She has never used smokeless tobacco. She reports that she drinks alcohol. She reports that she does not use drugs. She has a current medication list which includes the following prescription(s): perindopril and triamterene-hydrochlorothiazide. Current Outpatient Prescriptions on File Prior to Visit  Medication Sig Dispense Refill  . perindopril (ACEON) 4 MG tablet TAKE 1 AND 1/2 TABLETS     DAILY. PLEASE MAKE AN      APPOINTMENT WITH YOUR      DOCTOR 135 tablet 0  . triamterene-hydrochlorothiazide  (MAXZIDE-25) 37.5-25 MG tablet Take 1 tablet by mouth daily. 90 tablet 0   No current facility-administered medications on file prior to visit.    [name] allergic to penicillins..  Review of Systems Review of Systems  Constitutional: Negative for activity change, appetite change and fatigue.  HENT: Negative for hearing loss, congestion, tinnitus and ear discharge.  dentist q52m Eyes: Negative for visual disturbance (see optho q1y -- vision corrected to 20/20 with glasses).  Respiratory: Negative for cough, chest tightness and shortness of breath.   Cardiovascular: Negative for chest pain, palpitations and leg swelling.  Gastrointestinal: Negative for abdominal pain, diarrhea, constipation and abdominal distention.  Genitourinary: Negative for urgency, frequency, decreased urine volume and difficulty urinating.  Musculoskeletal: Negative for back pain, arthralgias and gait problem.  Skin: Negative for color change, pallor and rash.  Neurological: Negative for dizziness, light-headedness, numbness and headaches.  Hematological: Negative for adenopathy. Does not bruise/bleed easily.  Psychiatric/Behavioral: Negative for suicidal ideas, confusion, sleep disturbance, self-injury, dysphoric mood, decreased concentration and agitation.       Objective:    BP 136/84 (BP Location: Right Arm, Patient Position: Sitting, Cuff Size: Normal)   Pulse 77   Temp 98.3 F (36.8 C) (Oral)   Resp 17   Ht 5\' 6"  (1.676 m)   Wt 264 lb 6.4 oz (119.9 kg)   LMP 10/20/2010   SpO2 98%  BMI 42.68 kg/m  General appearance: alert, cooperative, appears stated age and no distress Head: Normocephalic, without obvious abnormality, atraumatic Eyes: conjunctivae/corneas clear. PERRL, EOM's intact. Fundi benign. Ears: normal TM's and external ear canals both ears Nose: Nares normal. Septum midline. Mucosa normal. No drainage or sinus tenderness. Throat: lips, mucosa, and tongue normal; teeth and gums  normal Neck: no adenopathy, no carotid bruit, no JVD, supple, symmetrical, trachea midline and thyroid not enlarged, symmetric, no tenderness/mass/nodules Back: symmetric, no curvature. ROM normal. No CVA tenderness. Lungs: clear to auscultation bilaterally Breasts: normal appearance, no masses or tenderness Heart: S1, S2 normal Abdomen: soft, non-tender; bowel sounds normal; no masses,  no organomegaly Pelvic: deferred Extremities: extremities normal, atraumatic, no cyanosis or edema Pulses: 2+ and symmetric Skin: Skin color, texture, turgor normal. No rashes or lesions Lymph nodes: Cervical, supraclavicular, and axillary nodes normal. Neurologic: Alert and oriented X 3, normal strength and tone. Normal symmetric reflexes. Normal coordination and gait    Assessment:    Healthy female exam.      Plan:    ghm utd Check labs See After Visit Summary for Counseling Recommendations    1. Preventative health care See above - Ambulatory referral to Gastroenterology - CBC with Differential/Platelet; Future - Lipid panel; Future - Comprehensive metabolic panel; Future - POCT urinalysis dipstick; Future - TSH; Future  2. Essential hypertension Stable , con't meds - triamterene-hydrochlorothiazide (MAXZIDE-25) 37.5-25 MG tablet; Take 1 tablet by mouth daily.  Dispense: 90 tablet; Refill: 3 - perindopril (ACEON) 4 MG tablet; TAKE 1 AND 1/2 TABLETS     DAILY. PLEASE MAKE AN      APPOINTMENT WITH YOUR      DOCTOR  Dispense: 135 tablet; Refill: 3  3. Encounter for immunization   - Flu Vaccine QUAD 36+ mos IM

## 2016-06-09 ENCOUNTER — Other Ambulatory Visit (INDEPENDENT_AMBULATORY_CARE_PROVIDER_SITE_OTHER): Payer: BLUE CROSS/BLUE SHIELD

## 2016-06-09 DIAGNOSIS — R319 Hematuria, unspecified: Secondary | ICD-10-CM

## 2016-06-09 DIAGNOSIS — Z0000 Encounter for general adult medical examination without abnormal findings: Secondary | ICD-10-CM | POA: Diagnosis not present

## 2016-06-09 LAB — COMPREHENSIVE METABOLIC PANEL
ALBUMIN: 3.9 g/dL (ref 3.5–5.2)
ALK PHOS: 55 U/L (ref 39–117)
ALT: 23 U/L (ref 0–35)
AST: 15 U/L (ref 0–37)
BILIRUBIN TOTAL: 1.6 mg/dL — AB (ref 0.2–1.2)
BUN: 10 mg/dL (ref 6–23)
CO2: 33 mEq/L — ABNORMAL HIGH (ref 19–32)
Calcium: 8.9 mg/dL (ref 8.4–10.5)
Chloride: 101 mEq/L (ref 96–112)
Creatinine, Ser: 0.92 mg/dL (ref 0.40–1.20)
GFR: 66.18 mL/min (ref >60.00)
GLUCOSE: 172 mg/dL — AB (ref 70–99)
POTASSIUM: 3.6 meq/L (ref 3.5–5.1)
SODIUM: 139 meq/L (ref 135–145)
TOTAL PROTEIN: 6.7 g/dL (ref 6.0–8.3)

## 2016-06-09 LAB — CBC WITH DIFFERENTIAL/PLATELET
Basophils Absolute: 0 10*3/uL (ref 0.0–0.1)
Basophils Relative: 0.5 % (ref 0.0–3.0)
EOS ABS: 0.1 10*3/uL (ref 0.0–0.7)
EOS PCT: 2.4 % (ref 0.0–5.0)
HEMATOCRIT: 38 % (ref 36.0–46.0)
HEMOGLOBIN: 12.1 g/dL (ref 12.0–15.0)
LYMPHS PCT: 26.3 % (ref 12.0–46.0)
Lymphs Abs: 1.3 10*3/uL (ref 0.7–4.0)
MCHC: 32 g/dL (ref 30.0–36.0)
Monocytes Absolute: 0.2 10*3/uL (ref 0.1–1.0)
Monocytes Relative: 4.6 % (ref 3.0–12.0)
NEUTROS ABS: 3.4 10*3/uL (ref 1.4–7.7)
Neutrophils Relative %: 66.2 % (ref 43.0–77.0)
Platelets: 287 10*3/uL (ref 150.0–400.0)
RBC: 5.78 Mil/uL — ABNORMAL HIGH (ref 3.87–5.11)
RDW: 16 % — AB (ref 11.5–15.5)
WBC: 5.1 10*3/uL (ref 4.0–10.5)

## 2016-06-09 LAB — POCT URINALYSIS DIPSTICK
BILIRUBIN UA: NEGATIVE
Glucose, UA: NEGATIVE
KETONES UA: NEGATIVE
LEUKOCYTES UA: NEGATIVE
Nitrite, UA: NEGATIVE
PH UA: 5.5
Urobilinogen, UA: 0.2

## 2016-06-09 LAB — LIPID PANEL
CHOLESTEROL: 143 mg/dL (ref 0–200)
HDL: 42.3 mg/dL (ref >39.00)
LDL Cholesterol: 84 mg/dL (ref 0–99)
NonHDL: 101.08
Total CHOL/HDL Ratio: 3
Triglycerides: 87 mg/dL (ref 0.0–149.0)
VLDL: 17.4 mg/dL (ref 0.0–40.0)

## 2016-06-09 LAB — TSH: TSH: 2.83 u[IU]/mL (ref 0.35–4.50)

## 2016-06-10 ENCOUNTER — Other Ambulatory Visit (INDEPENDENT_AMBULATORY_CARE_PROVIDER_SITE_OTHER): Payer: BLUE CROSS/BLUE SHIELD

## 2016-06-10 DIAGNOSIS — R7309 Other abnormal glucose: Secondary | ICD-10-CM | POA: Diagnosis not present

## 2016-06-10 LAB — HEMOGLOBIN A1C: Hgb A1c MFr Bld: 7.7 % — ABNORMAL HIGH (ref 4.6–6.5)

## 2016-06-10 LAB — URINE CULTURE

## 2016-06-22 ENCOUNTER — Ambulatory Visit
Admission: RE | Admit: 2016-06-22 | Discharge: 2016-06-22 | Disposition: A | Discharge status: Home / Self Care | Payer: BLUE CROSS/BLUE SHIELD | Source: Ambulatory Visit | Attending: Family Medicine | Admitting: Family Medicine

## 2016-06-22 DIAGNOSIS — Z1231 Encounter for screening mammogram for malignant neoplasm of breast: Secondary | ICD-10-CM | POA: Diagnosis not present

## 2016-06-29 ENCOUNTER — Ambulatory Visit: Payer: BLUE CROSS/BLUE SHIELD

## 2016-06-29 MED ORDER — ACCU-CHEK FASTCLIX LANCETS MISC: 12 refills | Status: DC

## 2016-06-29 MED ORDER — ACCU-CHEK GUIDE VI STRP: ORAL_STRIP | 12 refills | Status: DC

## 2016-06-29 MED ORDER — GLUCOSE BLOOD VI STRP: ORAL_STRIP | 12 refills | Status: DC

## 2016-06-30 NOTE — Progress Notes (Unsigned)
A user error has taken place: Erroneous encounter.    This encounter was created in error - please disregard.  This encounter was created in error - please disregard.

## 2016-07-20 ENCOUNTER — Encounter: Payer: Self-pay | Admitting: Family Medicine

## 2016-09-23 ENCOUNTER — Encounter: Payer: Self-pay | Admitting: Gastroenterology

## 2016-09-25 ENCOUNTER — Telehealth: Payer: Self-pay | Admitting: Family Medicine

## 2016-09-25 DIAGNOSIS — I10 Essential (primary) hypertension: Secondary | ICD-10-CM

## 2016-09-25 NOTE — Telephone Encounter (Signed)
Relation to UJ:WJXBpt:self Call back number:[phone] [facility] [address], [place], [place] [address] [phone]   Reason for call:  Patient requesting a refill of both blood pressure medications (patient doesn't the know the name), please advise

## 2016-09-28 MED ORDER — TRIAMTERENE-HCTZ 37.5-25 MG PO TABS: 1.0000 | ORAL_TABLET | Freq: Every day | ORAL | 3 refills | Status: DC

## 2016-09-28 NOTE — Telephone Encounter (Signed)
Pt says that she is completely out of her BP medication Pt would like a call back to be advised.    CB: [phone]

## 2016-09-28 NOTE — Telephone Encounter (Signed)
[facility]

## 2016-12-22 ENCOUNTER — Other Ambulatory Visit: Payer: Self-pay

## 2016-12-22 DIAGNOSIS — I10 Essential (primary) hypertension: Secondary | ICD-10-CM

## 2016-12-22 MED ORDER — PERINDOPRIL ERBUMINE 4 MG PO TABS: ORAL_TABLET | ORAL | 3 refills | Status: DC

## 2017-01-11 ENCOUNTER — Telehealth: Payer: Self-pay | Admitting: Family Medicine

## 2017-01-11 NOTE — Telephone Encounter (Signed)
Patient calling to follow up on form that she emailed in last Tuesday. States she needs the forms today if possible.

## 2017-01-12 NOTE — Telephone Encounter (Signed)
I did not get emailed forms----

## 2017-01-12 NOTE — Telephone Encounter (Signed)
I have looked as well and cannot find any forms in chart/asked Consulting civil engineer and she has not seen them. Called the patient on both cell/home number to call us back. Did in message let her know we do not have the forms. She will need to mychart them or fax to [place] or drop by the office.

## 2017-01-13 ENCOUNTER — Encounter: Payer: Self-pay | Admitting: Family Medicine

## 2017-01-13 NOTE — Telephone Encounter (Signed)
Form received today through MyChart.  Form has been filled in as much as possible and forwarded to PCP for review and signature.

## 2017-01-14 NOTE — Telephone Encounter (Signed)
done

## 2017-01-21 ENCOUNTER — Ambulatory Visit (INDEPENDENT_AMBULATORY_CARE_PROVIDER_SITE_OTHER): Payer: BLUE CROSS/BLUE SHIELD | Admitting: Family Medicine

## 2017-01-21 ENCOUNTER — Encounter: Payer: Self-pay | Admitting: Family Medicine

## 2017-01-21 VITALS — BP 122/76 | HR 66 | Temp 98.0°F | Resp 16 | Ht 66.0 in | Wt 237.0 lb

## 2017-01-21 DIAGNOSIS — I10 Essential (primary) hypertension: Secondary | ICD-10-CM | POA: Diagnosis not present

## 2017-01-21 DIAGNOSIS — E119 Type 2 diabetes mellitus without complications: Secondary | ICD-10-CM

## 2017-01-21 LAB — COMPREHENSIVE METABOLIC PANEL
ALT: 14 U/L (ref 0–35)
AST: 13 U/L (ref 0–37)
Albumin: 4.5 g/dL (ref 3.5–5.2)
Alkaline Phosphatase: 51 U/L (ref 39–117)
BUN: 11 mg/dL (ref 6–23)
CALCIUM: 9.5 mg/dL (ref 8.4–10.5)
CHLORIDE: 102 meq/L (ref 96–112)
CO2: 32 mEq/L (ref 19–32)
CREATININE: 0.97 mg/dL (ref 0.40–1.20)
GFR: 62.13 mL/min (ref >60.00)
GLUCOSE: 134 mg/dL — AB (ref 70–99)
POTASSIUM: 3.8 meq/L (ref 3.5–5.1)
Sodium: 140 mEq/L (ref 135–145)
Total Bilirubin: 1.4 mg/dL — ABNORMAL HIGH (ref 0.2–1.2)
Total Protein: 7 g/dL (ref 6.0–8.3)

## 2017-01-21 LAB — LIPID PANEL
CHOLESTEROL: 158 mg/dL (ref 0–200)
HDL: 49.1 mg/dL (ref >39.00)
LDL CALC: 92 mg/dL (ref 0–99)
NONHDL: 109.39
Total CHOL/HDL Ratio: 3
Triglycerides: 85 mg/dL (ref 0.0–149.0)
VLDL: 17 mg/dL (ref 0.0–40.0)

## 2017-01-21 LAB — HEMOGLOBIN A1C: HEMOGLOBIN A1C: 6.6 % — AB (ref 4.6–6.5)

## 2017-01-21 NOTE — Progress Notes (Signed)
Patient ID: Marissa Bostonamela H Leblanc, female   DOB: Jun 09, 1956, 61 y.o.   MRN: [id number]    Subjective:  I acted as a Neurosurgeonscribe for Dr. Zola ButtonLowne-Chase.  Apolonio SchneidersSheketia, CMA   Patient ID: Marissa Bostonamela H Leblanc, female    DOB: Jun 09, 1956, 61 y.o.   MRN: [id number]  Chief Complaint  Patient presents with  . Hypertension  . Diabetes    HPI  Patient is in today for follow up blood pressure and diabetes.  Glucose has been running between 130s-140s.   She has been controlling diabetes with diet.  She has not checked blood pressures at home.   Doing well on current treatment for blood pressure. \  HPI HYPERTENSION   Blood pressure range-not checking   Chest pain- no      Dyspnea- no Lightheadedness- no   Edema- no  Other side effects - no   Medication compliance: good Low salt diet- yes    DIABETES    Blood Sugar ranges-119-150  Polyuria- no New Visual problems- no  Hypoglycemic symptoms- no  Other side effects-no Medication compliance - good Last eye exam- due-- next month Foot exam- today     Patient Care Team: Donato SchultzLowne Chase, Yvonne R, DO as PCP - General   Past Medical History:  Diagnosis Date  . Hypertension     Past Surgical History:  Procedure Laterality Date  . TEAR DUCT PROBING      Family History  Problem Relation Age of Onset  . Hypertension Sister   . Diabetes Sister   . Hypertension Mother   . Hypertension Father   . Stroke Father   . Diabetes Unknown   . Angina Maternal Grandmother   . Heart attack Maternal Grandmother     Social History   Social History  . Marital status: Divorced    Spouse name: N/A  . Number of children: N/A  . Years of education: N/A   Occupational History  . [employer]   Social History Main Topics  . Smoking status: Never Smoker  . Smokeless tobacco: Never Used  . Alcohol use 0.0 oz/week     Comment: rare  . Drug use: No  . Sexual activity: Not Currently    Partners: Male   Other Topics Concern  . Not on file   Social  History Narrative   Exercise-- no---but she will start    Outpatient Medications Prior to Visit  Medication Sig Dispense Refill  . ACCU-CHEK FASTCLIX LANCETS MISC Check blood sugar twice daily. Dx:E11.9 100 each 12  . ACCU-CHEK GUIDE test strip Check blood sugar twice daily. Dx: E11.9 100 each 12  . perindopril (ACEON) 4 MG tablet TAKE 1 AND 1/2 TABLETS 135 tablet 3  . triamterene-hydrochlorothiazide (MAXZIDE-25) 37.5-25 MG tablet Take 1 tablet by mouth daily. 90 tablet 3   No facility-administered medications prior to visit.     Allergies  Allergen Reactions  . Penicillins Hives    Review of Systems  Constitutional: Negative for fever and malaise/fatigue.  HENT: Negative for congestion.   Eyes: Negative for blurred vision.  Respiratory: Negative for cough and shortness of breath.   Cardiovascular: Negative for chest pain, palpitations and leg swelling.  Gastrointestinal: Negative for vomiting.  Musculoskeletal: Negative for back pain.  Skin: Negative for rash.  Neurological: Negative for loss of consciousness and headaches.       Objective:    Physical Exam  Constitutional: [name] oriented to person, place, and time. She appears well-developed and well-nourished. No distress.  HENT:  Head: Normocephalic and atraumatic.  Eyes: Conjunctivae and EOM are normal.  Neck: Normal range of motion. Neck supple. No JVD present. Carotid bruit is not present. No thyromegaly present.  Cardiovascular: Normal rate, regular rhythm and normal heart sounds.   No murmur heard. Pulmonary/Chest: Effort normal and breath sounds normal. No respiratory distress. She has no wheezes. She has no rales. She exhibits no tenderness.  Abdominal: Soft. Bowel sounds are normal. There is no tenderness.  Musculoskeletal: Normal range of motion. She exhibits no edema or deformity.  Sensory exam of the foot is normal, tested with the monofilament. Good pulses, no lesions or ulcers, good peripheral pulses.     Neurological: [name] alert and oriented to person, place, and time.  Skin: Skin is warm and dry. [name] not diaphoretic.  Psychiatric: She has a normal mood and affect.  Nursing note and vitals reviewed.   BP 122/76 (BP Location: Left Arm, Cuff Size: Normal)   Pulse 66   Temp 98 F (36.7 C) (Oral)   Resp 16   Ht 5\' 6"  (1.676 m)   Wt 237 lb (107.5 kg)   LMP 10/20/2010   SpO2 97%   BMI 38.25 kg/m  Wt Readings from Last 3 Encounters:  01/21/17 237 lb (107.5 kg)  06/08/16 264 lb 6.4 oz (119.9 kg)  03/12/15 258 lb 3.2 oz (117.1 kg)   BP Readings from Last 3 Encounters:  01/21/17 122/76  06/08/16 136/84  03/12/15 114/78     Immunization History  Administered Date(s) Administered  . Influenza Whole 07/12/2006, 08/09/2007, 07/11/2008, 06/27/2009  . Influenza, Seasonal, Injecte, Preservative Fre 09/30/2012  . Influenza,inj,Quad PF,36+ Mos 06/08/2016  . Influenza-Unspecified 06/14/2014  . Td 07/12/2006    Health Maintenance  Topic Date Due  . Hepatitis C Screening  [birth year]  . PNEUMOCOCCAL POLYSACCHARIDE VACCINE (1) 05/29/1958  . OPHTHALMOLOGY EXAM  05/29/1966  . HIV Screening  05/30/1971  . TETANUS/TDAP  07/12/2016  . COLONOSCOPY  09/23/2016  . HEMOGLOBIN A1C  12/08/2016  . PAP SMEAR  02/07/2017  . INFLUENZA VACCINE  04/14/2017  . FOOT EXAM  01/21/2018  . MAMMOGRAM  06/22/2018    Lab Results  Component Value Date   WBC 5.1 06/09/2016   HGB 12.1 06/09/2016   HCT 38.0 06/09/2016   PLT 287.0 06/09/2016   GLUCOSE 172 (H) 06/09/2016   CHOL 143 06/09/2016   TRIG 87.0 06/09/2016   HDL 42.30 06/09/2016   LDLDIRECT 116.7 04/03/2010   LDLCALC 84 06/09/2016   ALT 23 06/09/2016   AST 15 06/09/2016   NA 139 06/09/2016   K 3.6 06/09/2016   CL 101 06/09/2016   CREATININE 0.92 06/09/2016   BUN 10 06/09/2016   CO2 33 (H) 06/09/2016   TSH 2.83 06/09/2016   HGBA1C 7.7 (H) 06/10/2016   MICROALBUR 1.6 03/27/2015    Lab Results  Component Value Date   TSH 2.83  06/09/2016   Lab Results  Component Value Date   WBC 5.1 06/09/2016   HGB 12.1 06/09/2016   HCT 38.0 06/09/2016   MCV 65.7 Repeated and verified X2. (L) 06/09/2016   PLT 287.0 06/09/2016   Lab Results  Component Value Date   NA 139 06/09/2016   K 3.6 06/09/2016   CO2 33 (H) 06/09/2016   GLUCOSE 172 (H) 06/09/2016   BUN 10 06/09/2016   CREATININE 0.92 06/09/2016   BILITOT 1.6 (H) 06/09/2016   ALKPHOS 55 06/09/2016   AST 15 06/09/2016   ALT 23 06/09/2016  PROT 6.7 06/09/2016   ALBUMIN 3.9 06/09/2016   CALCIUM 8.9 06/09/2016   GFR 66.18 06/09/2016   Lab Results  Component Value Date   CHOL 143 06/09/2016   Lab Results  Component Value Date   HDL 42.30 06/09/2016   Lab Results  Component Value Date   LDLCALC 84 06/09/2016   Lab Results  Component Value Date   TRIG 87.0 06/09/2016   Lab Results  Component Value Date   CHOLHDL 3 06/09/2016   Lab Results  Component Value Date   HGBA1C 7.7 (H) 06/10/2016         Assessment & Plan:   Problem List Items Addressed This Visit      Unprioritized   Essential hypertension - Primary    Well controlled, no changes to meds. Encouraged heart healthy diet such as the DASH diet and exercise as tolerated.       Relevant Orders   Hemoglobin A1c   Comprehensive metabolic panel   Lipid panel   Type 2 diabetes mellitus without complication, without long-term current use of insulin (HCC)    Not on meds at this time Recheck labs  con't diet and glucose monitoring      Relevant Orders   Hemoglobin A1c   Comprehensive metabolic panel   Lipid panel   Ambulatory referral to diabetic education      I am having Ms. Tessier maintain her ACCU-CHEK FASTCLIX LANCETS, ACCU-CHEK GUIDE, triamterene-hydrochlorothiazide, and perindopril.  No orders of the defined types were placed in this encounter.   CMA served as Neurosurgeon during this visit. History, Physical and Plan performed by medical provider. Documentation and orders  reviewed and attested to.   Donato Schultz, DO

## 2017-01-21 NOTE — Assessment & Plan Note (Signed)
Well controlled, no changes to meds. Encouraged heart healthy diet such as the DASH diet and exercise as tolerated.  °

## 2017-01-21 NOTE — Assessment & Plan Note (Signed)
Not on meds at this time Recheck labs  con't diet and glucose monitoring

## 2017-01-21 NOTE — Patient Instructions (Signed)
Carbohydrate Counting for Diabetes Mellitus, Adult Carbohydrate counting is a method for keeping track of how many carbohydrates you eat. Eating carbohydrates naturally increases the amount of sugar (glucose) in the blood. Counting how many carbohydrates you eat helps keep your blood glucose within normal limits, which helps you manage your diabetes (diabetes mellitus). It is important to know how many carbohydrates you can safely have in each meal. This is different for every person. A diet and nutrition specialist (registered dietitian) can help you make a meal plan and calculate how many carbohydrates you should have at each meal and snack. Carbohydrates are found in the following foods:  Grains, such as breads and cereals.  Dried beans and soy products.  Starchy vegetables, such as potatoes, peas, and corn.  Fruit and fruit juices.  Milk and yogurt.  Sweets and snack foods, such as cake, cookies, candy, chips, and soft drinks. How do I count carbohydrates? There are two ways to count carbohydrates in food. You can use either of the methods or a combination of both. Reading "Nutrition Facts" on packaged food  The "Nutrition Facts" list is included on the labels of almost all packaged foods and beverages in the [place] It includes:  The serving size.  Information about nutrients in each serving, including the grams (g) of carbohydrate per serving. To use the "Nutrition Facts":  Decide how many servings you will have.  Multiply the number of servings by the number of carbohydrates per serving.  The resulting number is the total amount of carbohydrates that you will be having. Learning standard serving sizes of other foods  When you eat foods containing carbohydrates that are not packaged or do not include "Nutrition Facts" on the label, you need to measure the servings in order to count the amount of carbohydrates:  Measure the foods that you will eat with a food scale or measuring  cup, if needed.  Decide how many standard-size servings you will eat.  Multiply the number of servings by 15. Most carbohydrate-rich foods have about 15 g of carbohydrates per serving.  For example, if you eat 8 oz (170 g) of strawberries, you will have eaten 2 servings and 30 g of carbohydrates (2 servings x 15 g = 30 g).  For foods that have more than one food mixed, such as soups and casseroles, you must count the carbohydrates in each food that is included. The following list contains standard serving sizes of common carbohydrate-rich foods. Each of these servings has about 15 g of carbohydrates:   hamburger bun or  English muffin.   oz (15 mL) syrup.   oz (14 g) jelly.  1 slice of bread.  1 six-inch tortilla.  3 oz (85 g) cooked rice or pasta.  4 oz (113 g) cooked dried beans.  4 oz (113 g) starchy vegetable, such as peas, corn, or potatoes.  4 oz (113 g) hot cereal.  4 oz (113 g) mashed potatoes or  of a large baked potato.  4 oz (113 g) canned or frozen fruit.  4 oz (120 mL) fruit juice.  4-6 crackers.  6 chicken nuggets.  6 oz (170 g) unsweetened dry cereal.  6 oz (170 g) plain fat-free yogurt or yogurt sweetened with artificial sweeteners.  8 oz (240 mL) milk.  8 oz (170 g) fresh fruit or one small piece of fruit.  24 oz (680 g) popped popcorn. Example of carbohydrate counting Sample meal  3 oz (85 g) chicken breast.  6 oz (  170 g) brown rice.  4 oz (113 g) corn.  8 oz (240 mL) milk.  8 oz (170 g) strawberries with sugar-free whipped topping. Carbohydrate calculation 1. Identify the foods that contain carbohydrates:  Rice.  Corn.  Milk.  Strawberries. 2. Calculate how many servings you have of each food:  2 servings rice.  1 serving corn.  1 serving milk.  1 serving strawberries. 3. Multiply each number of servings by 15 g:  2 servings rice x 15 g = 30 g.  1 serving corn x 15 g = 15 g.  1 serving milk x 15 g = 15  g.  1 serving strawberries x 15 g = 15 g. 4. Add together all of the amounts to find the total grams of carbohydrates eaten:  30 g + 15 g + 15 g + 15 g = 75 g of carbohydrates total. This information is not intended to replace advice given to you by your health care provider. Make sure you discuss any questions you have with your health care provider. Document Released: 08/31/2005 Document Revised: 03/20/2016 Document Reviewed: 02/12/2016 Elsevier Interactive Patient Education  2017 [employer]  

## 2017-03-08 ENCOUNTER — Encounter: Payer: Self-pay | Admitting: Family Medicine

## 2017-03-08 DIAGNOSIS — E119 Type 2 diabetes mellitus without complications: Secondary | ICD-10-CM | POA: Diagnosis not present

## 2017-03-08 LAB — HM DIABETES EYE EXAM

## 2017-03-26 ENCOUNTER — Encounter: Payer: BLUE CROSS/BLUE SHIELD | Attending: Family Medicine | Admitting: *Deleted

## 2017-03-26 DIAGNOSIS — E119 Type 2 diabetes mellitus without complications: Secondary | ICD-10-CM | POA: Diagnosis not present

## 2017-03-26 DIAGNOSIS — Z713 Dietary counseling and surveillance: Secondary | ICD-10-CM | POA: Diagnosis not present

## 2017-03-26 NOTE — Patient Instructions (Signed)
Plan:  Aim for 2 Carb Choices per meal (30 grams) +/- 1 either way  Aim for 0-1 Carbs per snack if hungry  Include protein in moderation with your meals and snacks Consider reading food labels for Total Carbohydrate of foods Consider  increasing your activity level by dancing, walking or Arm chair exercises  for 5-10 minutes daily and increase as tolerated Consider checking BG at alternate times per day as directed by MD

## 2017-03-26 NOTE — Progress Notes (Signed)
Diabetes Self-Management Education  Visit Type: First/Initial  Appt. Start Time: 0815 Appt. End Time: 0915  03/26/2017  Ms. Marissa Leblanc, identified by name and date of birth, is a 61 y.o. female with a diagnosis of Diabetes: Type 2. She states she is in a stressful job and is quite frustrated with that. She was diagnosed last October and has been trying to eat healthier but has many questions about what that means. [name] testing typically first thing in the AM with average BG around 130 mg/dl. No exercise at this time, she works from home.   ASSESSMENT  Last menstrual period 10/20/2010. There is no height or weight on file to calculate BMI.  Declined      Diabetes Self-Management Education - 03/26/17 0814      Visit Information   Visit Type First/Initial     Initial Visit   Diabetes Type Type 2   Are you currently following a meal plan? No   Are you taking your medications as prescribed? Not on Medications   Date Diagnosed 06/2016     Health Coping   How would you rate your overall health? Good     Psychosocial Assessment   Patient Belief/Attitude about Diabetes Motivated to manage diabetes   Self-care barriers None   Other persons present Patient   Patient Concerns Nutrition/Meal planning   Special Needs None   Learning Readiness Change in progress   How often do you need to have someone help you when you read instructions, pamphlets, or other written materials from your doctor or pharmacy? 1 - Never   What is the last grade level you completed in school? some college     Pre-Education Assessment   Patient understands the diabetes disease and treatment process. Needs Instruction   Patient understands incorporating nutritional management into lifestyle. Needs Instruction   Patient undertands incorporating physical activity into lifestyle. Needs Instruction   Patient understands using medications safely. Needs Instruction   Patient understands monitoring blood glucose,  interpreting and using results Needs Instruction   Patient understands prevention, detection, and treatment of acute complications. Needs Instruction   Patient understands prevention, detection, and treatment of chronic complications. Needs Instruction   Patient understands how to develop strategies to address psychosocial issues. Needs Instruction   Patient understands how to develop strategies to promote health/change behavior. Needs Instruction     Complications   Last HgB A1C per patient/outside source 6.6 %   How often do you check your blood sugar? 1-2 times/day   Fasting Blood glucose range (mg/dL) [id number]   Number of hypoglycemic episodes per month 0   Have you had a dilated eye exam in the past 12 months? Yes   Have you had a dental exam in the past 12 months? Yes   Are you checking your feet? Yes   How many days per week are you checking your feet? 7     Dietary Intake   Breakfast whole wheat toast with PNB   Lunch salad, fresh fruit OR sandwich OR pasta salad OR homemade mini pizza, pita with hummus and cukes, cheese   Snack (afternoon) fresh fruit and cheese, occasionally chips   Dinner meal salad with chicken OR lean meat, starch, vegetables OR sometimes a meatless meal   Snack (evening) sugar free cookies or ice cream OR cheese and crackers   Beverage(s) diet soda, water     Exercise   Exercise Type ADL's     Patient Education   Previous Diabetes Education  No   Disease state  Definition of diabetes, type 1 and 2, and the diagnosis of diabetes;Factors that contribute to the development of diabetes   Nutrition management  Role of diet in the treatment of diabetes and the relationship between the three main macronutrients and blood glucose level;Food label reading, portion sizes and measuring food.;Carbohydrate counting   Physical activity and exercise  Role of exercise on diabetes management, blood pressure control and cardiac health.   Monitoring Identified appropriate  SMBG and/or A1C goals.;Purpose and frequency of SMBG.   Chronic complications Relationship between chronic complications and blood glucose control     Individualized Goals (developed by patient)   Nutrition Follow meal plan discussed   Physical Activity Exercise 3-5 times per week   Medications Not Applicable   Monitoring  test blood glucose pre and post meals as discussed     Post-Education Assessment   Patient understands the diabetes disease and treatment process. Demonstrates understanding / competency   Patient understands incorporating nutritional management into lifestyle. Demonstrates understanding / competency   Patient undertands incorporating physical activity into lifestyle. Demonstrates understanding / competency   Patient understands monitoring blood glucose, interpreting and using results Demonstrates understanding / competency     Outcomes   Expected Outcomes Demonstrated interest in learning. Expect positive outcomes   Future DMSE PRN   Program Status Completed      Individualized Plan for Diabetes Self-Management Training:   Learning Objective:  Patient will have a greater understanding of diabetes self-management. Patient education plan is to attend individual and/or group sessions per assessed needs and concerns.   Plan:   Patient Instructions  Plan:  Aim for 2 Carb Choices per meal (30 grams) +/- 1 either way  Aim for 0-1 Carbs per snack if hungry  Include protein in moderation with your meals and snacks Consider reading food labels for Total Carbohydrate of foods Consider  increasing your activity level by dancing, walking or Arm chair exercises  for 5-10 minutes daily and increase as tolerated Consider checking BG at alternate times per day as directed by MD   Expected Outcomes:  Demonstrated interest in learning. Expect positive outcomes  Education material provided: Living Well with Diabetes, A1C conversion sheet, Meal plan card and Carbohydrate  counting sheet, Arm Chair Exercise handout  If problems or questions, patient to contact team via:  Phone  Future DSME appointment: PRN

## 2017-04-12 ENCOUNTER — Encounter: Payer: Self-pay | Admitting: Family Medicine

## 2017-04-12 ENCOUNTER — Ambulatory Visit (INDEPENDENT_AMBULATORY_CARE_PROVIDER_SITE_OTHER): Payer: BLUE CROSS/BLUE SHIELD | Admitting: Family Medicine

## 2017-04-12 VITALS — BP 122/68 | HR 53 | Temp 97.8°F | Ht 66.0 in | Wt 235.0 lb

## 2017-04-12 DIAGNOSIS — H6502 Acute serous otitis media, left ear: Secondary | ICD-10-CM | POA: Diagnosis not present

## 2017-04-12 MED ORDER — LEVOCETIRIZINE DIHYDROCHLORIDE 5 MG PO TABS: 5.0000 mg | ORAL_TABLET | Freq: Every evening | ORAL | 5 refills | Status: DC

## 2017-04-12 MED ORDER — FLUTICASONE PROPIONATE 50 MCG/ACT NA SUSP: 2.0000 | Freq: Every day | NASAL | 6 refills | Status: DC

## 2017-04-12 NOTE — Progress Notes (Signed)
Pre visit review using our clinic review tool, if applicable. No additional management support is needed unless otherwise documented below in the visit note. 

## 2017-04-12 NOTE — Assessment & Plan Note (Signed)
flonase and xyzal  If worsens or no better in next few weeks -- refer to ENT

## 2017-04-12 NOTE — Progress Notes (Signed)
Patient ID: Marissa Bostonamela H Rhome, female    DOB: Jan 21, 1956  Age: 61 y.o. MRN: [id number]    Subjective:  Subjective  HPI Marissa Leblanc presents for L ear ringing and pressure x few days.  It has improved .   Review of Systems  Constitutional: Negative for appetite change, diaphoresis, fatigue and unexpected weight change.  HENT: Positive for postnasal drip, rhinorrhea, sneezing and tinnitus.   Eyes: Negative for pain, redness and visual disturbance.  Respiratory: Negative for cough, chest tightness, shortness of breath and wheezing.   Cardiovascular: Negative for chest pain, palpitations and leg swelling.  Endocrine: Negative for cold intolerance, heat intolerance, polydipsia, polyphagia and polyuria.  Genitourinary: Negative for difficulty urinating, dysuria and frequency.  Neurological: Negative for dizziness, light-headedness, numbness and headaches.    History Past Medical History:  Diagnosis Date  . Hypertension     She has a past surgical history that includes Tear duct probing.   Her family history includes Angina in her maternal grandmother; Diabetes in her sister and unknown relative; Heart attack in her maternal grandmother; Hypertension in her father, mother, and sister; Stroke in her father.She reports that she has never smoked. She has never used smokeless tobacco. She reports that she drinks alcohol. She reports that she does not use drugs.  Current Outpatient Prescriptions on File Prior to Visit  Medication Sig Dispense Refill  . ACCU-CHEK FASTCLIX LANCETS MISC Check blood sugar twice daily. Dx:E11.9 100 each 12  . ACCU-CHEK GUIDE test strip Check blood sugar twice daily. Dx: E11.9 100 each 12  . perindopril (ACEON) 4 MG tablet TAKE 1 AND 1/2 TABLETS 135 tablet 3  . triamterene-hydrochlorothiazide (MAXZIDE-25) 37.5-25 MG tablet Take 1 tablet by mouth daily. 90 tablet 3   No current facility-administered medications on file prior to visit.      Objective:  Objective   Physical Exam  Constitutional: [name] oriented to person, place, and time. She appears well-developed and well-nourished.  HENT:  Head: Normocephalic and atraumatic.  Right Ear: Hearing, tympanic membrane, external ear and ear canal normal.  Left Ear: External ear and ear canal normal. Tympanic membrane is bulging. A middle ear effusion is present. No decreased hearing is noted.  Eyes: Conjunctivae and EOM are normal.  Neck: Normal range of motion. Neck supple. No JVD present. Carotid bruit is not present. No thyromegaly present.  Cardiovascular: Normal rate, regular rhythm and normal heart sounds.   No murmur heard. Pulmonary/Chest: Effort normal and breath sounds normal. No respiratory distress. She has no wheezes. She has no rales. She exhibits no tenderness.  Musculoskeletal: She exhibits no edema.  Neurological: [name] alert and oriented to person, place, and time.  Psychiatric: She has a normal mood and affect.  Nursing note and vitals reviewed.  BP 122/68 (BP Location: Left Arm, Patient Position: Sitting, Cuff Size: Large)   Pulse (!) 53   Temp 97.8 F (36.6 C) (Oral)   Ht 5\' 6"  (1.676 m)   Wt 235 lb (106.6 kg)   LMP 10/20/2010   SpO2 97%   BMI 37.93 kg/m  Wt Readings from Last 3 Encounters:  04/12/17 235 lb (106.6 kg)  01/21/17 237 lb (107.5 kg)  06/08/16 264 lb 6.4 oz (119.9 kg)     Lab Results  Component Value Date   WBC 5.1 06/09/2016   HGB 12.1 06/09/2016   HCT 38.0 06/09/2016   PLT 287.0 06/09/2016   GLUCOSE 134 (H) 01/21/2017   CHOL 158 01/21/2017   TRIG 85.0  01/21/2017   HDL 49.10 01/21/2017   LDLDIRECT 116.7 04/03/2010   LDLCALC 92 01/21/2017   ALT 14 01/21/2017   AST 13 01/21/2017   NA 140 01/21/2017   K 3.8 01/21/2017   CL 102 01/21/2017   CREATININE 0.97 01/21/2017   BUN 11 01/21/2017   CO2 32 01/21/2017   TSH 2.83 06/09/2016   HGBA1C 6.6 (H) 01/21/2017   MICROALBUR 1.6 03/27/2015    Mm Digital Screening Bilateral  Result Date:  06/22/2016 CLINICAL DATA:  Screening. EXAM: DIGITAL SCREENING BILATERAL MAMMOGRAM WITH CAD COMPARISON:  Previous exam(s). ACR Breast Density Category b: There are scattered areas of fibroglandular density. FINDINGS: There are no findings suspicious for malignancy. Images were processed with CAD. IMPRESSION: No mammographic evidence of malignancy. A result letter of this screening mammogram will be mailed directly to the patient. RECOMMENDATION: Screening mammogram in one year. (Code:SM-B-01Y) BI-RADS CATEGORY  1: Negative. Electronically Signed   By: Elberta Fortisaniel  Boyle M.D.   On: 06/25/2016 09:58     Assessment & Plan:  Plan  I am having Ms. Tibbetts start on fluticasone and levocetirizine. I am also having her maintain her ACCU-CHEK FASTCLIX LANCETS, ACCU-CHEK GUIDE, triamterene-hydrochlorothiazide, and perindopril.  Meds ordered this encounter  Medications  . fluticasone (FLONASE) 50 MCG/ACT nasal spray    Sig: Place 2 sprays into both nostrils daily.    Dispense:  16 g    Refill:  6  . levocetirizine (XYZAL) 5 MG tablet    Sig: Take 1 tablet (5 mg total) by mouth every evening.    Dispense:  30 tablet    Refill:  5    Problem List Items Addressed This Visit      Unprioritized   Acute serous otitis media of left ear - Primary    flonase and xyzal  If worsens or no better in next few weeks -- refer to ENT      Relevant Medications   fluticasone (FLONASE) 50 MCG/ACT nasal spray   levocetirizine (XYZAL) 5 MG tablet      Follow-up: Return if symptoms worsen or fail to improve.  Donato SchultzYvonne R Lowne Chase, DO

## 2017-04-12 NOTE — Patient Instructions (Signed)

## 2017-08-16 ENCOUNTER — Other Ambulatory Visit: Payer: Self-pay | Admitting: Family Medicine

## 2017-08-16 DIAGNOSIS — Z1231 Encounter for screening mammogram for malignant neoplasm of breast: Secondary | ICD-10-CM

## 2017-08-20 ENCOUNTER — Ambulatory Visit
Admission: RE | Admit: 2017-08-20 | Discharge: 2017-08-20 | Disposition: A | Discharge status: Home / Self Care | Payer: BLUE CROSS/BLUE SHIELD | Source: Ambulatory Visit | Attending: Family Medicine | Admitting: Family Medicine

## 2017-08-20 DIAGNOSIS — Z1231 Encounter for screening mammogram for malignant neoplasm of breast: Secondary | ICD-10-CM | POA: Diagnosis not present

## 2017-09-17 ENCOUNTER — Other Ambulatory Visit: Payer: Self-pay | Admitting: Family Medicine

## 2017-09-17 DIAGNOSIS — I10 Essential (primary) hypertension: Secondary | ICD-10-CM

## 2017-10-05 ENCOUNTER — Telehealth: Payer: Self-pay | Admitting: Family Medicine

## 2017-10-05 NOTE — Telephone Encounter (Signed)
Left message with pt that this appt needs to be rescheduled per pcp.

## 2017-10-14 ENCOUNTER — Encounter: Payer: BLUE CROSS/BLUE SHIELD | Admitting: Family Medicine

## 2017-11-25 ENCOUNTER — Ambulatory Visit (INDEPENDENT_AMBULATORY_CARE_PROVIDER_SITE_OTHER): Payer: BLUE CROSS/BLUE SHIELD | Admitting: Family Medicine

## 2017-11-25 ENCOUNTER — Encounter: Payer: Self-pay | Admitting: Family Medicine

## 2017-11-25 ENCOUNTER — Other Ambulatory Visit (HOSPITAL_COMMUNITY)
Admission: RE | Admit: 2017-11-25 | Discharge: 2017-11-25 | Disposition: A | Discharge status: Home / Self Care | Payer: BLUE CROSS/BLUE SHIELD | Source: Ambulatory Visit | Attending: Family Medicine | Admitting: Family Medicine

## 2017-11-25 VITALS — BP 118/58 | HR 78 | Temp 98.4°F | Resp 18 | Ht 66.0 in | Wt 230.2 lb

## 2017-11-25 DIAGNOSIS — I10 Essential (primary) hypertension: Secondary | ICD-10-CM | POA: Diagnosis not present

## 2017-11-25 DIAGNOSIS — H6502 Acute serous otitis media, left ear: Secondary | ICD-10-CM | POA: Diagnosis not present

## 2017-11-25 DIAGNOSIS — Z0000 Encounter for general adult medical examination without abnormal findings: Secondary | ICD-10-CM

## 2017-11-25 DIAGNOSIS — E1151 Type 2 diabetes mellitus with diabetic peripheral angiopathy without gangrene: Secondary | ICD-10-CM

## 2017-11-25 DIAGNOSIS — Z23 Encounter for immunization: Secondary | ICD-10-CM | POA: Diagnosis not present

## 2017-11-25 DIAGNOSIS — IMO0002 Reserved for concepts with insufficient information to code with codable children: Secondary | ICD-10-CM

## 2017-11-25 DIAGNOSIS — E1165 Type 2 diabetes mellitus with hyperglycemia: Secondary | ICD-10-CM | POA: Diagnosis not present

## 2017-11-25 LAB — CBC WITH DIFFERENTIAL/PLATELET
Basophils Absolute: 0 10*3/uL (ref 0.0–0.1)
Basophils Relative: 0.8 % (ref 0.0–3.0)
EOS PCT: 1.8 % (ref 0.0–5.0)
Eosinophils Absolute: 0.1 10*3/uL (ref 0.0–0.7)
HCT: 40.5 % (ref 36.0–46.0)
HEMOGLOBIN: 13 g/dL (ref 12.0–15.0)
Lymphocytes Relative: 29.4 % (ref 12.0–46.0)
Lymphs Abs: 1.9 10*3/uL (ref 0.7–4.0)
MCHC: 32.1 g/dL (ref 30.0–36.0)
MONOS PCT: 4.9 % (ref 3.0–12.0)
Monocytes Absolute: 0.3 10*3/uL (ref 0.1–1.0)
Neutro Abs: 4 10*3/uL (ref 1.4–7.7)
Neutrophils Relative %: 63.1 % (ref 43.0–77.0)
Platelets: 326 10*3/uL (ref 150.0–400.0)
RBC: 6.41 Mil/uL — AB (ref 3.87–5.11)
RDW: 15.2 % (ref 11.5–15.5)
WBC: 6.3 10*3/uL (ref 4.0–10.5)

## 2017-11-25 LAB — LIPID PANEL
CHOL/HDL RATIO: 3
Cholesterol: 153 mg/dL (ref 0–200)
HDL: 47.6 mg/dL (ref >39.00)
LDL CALC: 85 mg/dL (ref 0–99)
NONHDL: 105.11
Triglycerides: 101 mg/dL (ref 0.0–149.0)
VLDL: 20.2 mg/dL (ref 0.0–40.0)

## 2017-11-25 LAB — COMPREHENSIVE METABOLIC PANEL
ALK PHOS: 49 U/L (ref 39–117)
ALT: 12 U/L (ref 0–35)
AST: 10 U/L (ref 0–37)
Albumin: 4.5 g/dL (ref 3.5–5.2)
BUN: 10 mg/dL (ref 6–23)
CO2: 34 mEq/L — ABNORMAL HIGH (ref 19–32)
Calcium: 9.6 mg/dL (ref 8.4–10.5)
Chloride: 99 mEq/L (ref 96–112)
Creatinine, Ser: 1 mg/dL (ref 0.40–1.20)
GFR: 59.81 mL/min — AB (ref >60.00)
Glucose, Bld: 122 mg/dL — ABNORMAL HIGH (ref 70–99)
POTASSIUM: 3.5 meq/L (ref 3.5–5.1)
Sodium: 137 mEq/L (ref 135–145)
TOTAL PROTEIN: 7 g/dL (ref 6.0–8.3)
Total Bilirubin: 1.2 mg/dL (ref 0.2–1.2)

## 2017-11-25 LAB — HEMOGLOBIN A1C: HEMOGLOBIN A1C: 6.3 % (ref 4.6–6.5)

## 2017-11-25 LAB — TSH: TSH: 1.93 u[IU]/mL (ref 0.35–4.50)

## 2017-11-25 MED ORDER — PERINDOPRIL ERBUMINE 4 MG PO TABS: ORAL_TABLET | ORAL | 3 refills | Status: DC

## 2017-11-25 MED ORDER — ACCU-CHEK GUIDE VI STRP: ORAL_STRIP | 12 refills | Status: AC

## 2017-11-25 MED ORDER — ACCU-CHEK FASTCLIX LANCETS MISC: 12 refills | Status: DC

## 2017-11-25 MED ORDER — LEVOCETIRIZINE DIHYDROCHLORIDE 5 MG PO TABS: 5.0000 mg | ORAL_TABLET | Freq: Every evening | ORAL | 5 refills | Status: DC

## 2017-11-25 MED ORDER — TRIAMTERENE-HCTZ 37.5-25 MG PO TABS: 1.0000 | ORAL_TABLET | Freq: Every day | ORAL | 0 refills | Status: DC

## 2017-11-25 NOTE — Addendum Note (Signed)
Addended by: Steve RattlerBLEVINS, BAILEY A on: 11/25/2017 02:46 PM   Modules accepted: Orders

## 2017-11-25 NOTE — Patient Instructions (Signed)
Preventive Care 40-64 Years, Female Preventive care refers to lifestyle choices and visits with your health care provider that can promote health and wellness. What does preventive care include?  A yearly physical exam. This is also called an annual well check.  Dental exams once or twice a year.  Routine eye exams. Ask your health care provider how often you should have your eyes checked.  Personal lifestyle choices, including: ? Daily care of your teeth and gums. ? Regular physical activity. ? Eating a healthy diet. ? Avoiding tobacco and drug use. ? Limiting alcohol use. ? Practicing safe sex. ? Taking low-dose aspirin daily starting at age 58. ? Taking vitamin and mineral supplements as recommended by your health care provider. What happens during an annual well check? The services and screenings done by your health care provider during your annual well check will depend on your age, overall health, lifestyle risk factors, and family history of disease. Counseling Your health care provider may ask you questions about your:  Alcohol use.  Tobacco use.  Drug use.  Emotional well-being.  Home and relationship well-being.  Sexual activity.  Eating habits.  Work and work Statistician.  Method of birth control.  Menstrual cycle.  Pregnancy history.  Screening You may have the following tests or measurements:  Height, weight, and BMI.  Blood pressure.  Lipid and cholesterol levels. These may be checked every 5 years, or more frequently if you are over 81 years old.  Skin check.  Lung cancer screening. You may have this screening every year starting at age 78 if you have a 30-pack-year history of smoking and currently smoke or have quit within the past 15 years.  Fecal occult blood test (FOBT) of the stool. You may have this test every year starting at age 65.  Flexible sigmoidoscopy or colonoscopy. You may have a sigmoidoscopy every 5 years or a colonoscopy  every 10 years starting at age 30.  Hepatitis C blood test.  Hepatitis B blood test.  Sexually transmitted disease (STD) testing.  Diabetes screening. This is done by checking your blood sugar (glucose) after you have not eaten for a while (fasting). You may have this done every 1-3 years.  Mammogram. This may be done every 1-2 years. Talk to your health care provider about when you should start having regular mammograms. This may depend on whether you have a family history of breast cancer.  BRCA-related cancer screening. This may be done if you have a family history of breast, ovarian, tubal, or peritoneal cancers.  Pelvic exam and Pap test. This may be done every 3 years starting at age 80. Starting at age 36, this may be done every 5 years if you have a Pap test in combination with an HPV test.  Bone density scan. This is done to screen for osteoporosis. You may have this scan if you are at high risk for osteoporosis.  Discuss your test results, treatment options, and if necessary, the need for more tests with your health care provider. Vaccines Your health care provider may recommend certain vaccines, such as:  Influenza vaccine. This is recommended every year.  Tetanus, diphtheria, and acellular pertussis (Tdap, Td) vaccine. You may need a Td booster every 10 years.  Varicella vaccine. You may need this if you have not been vaccinated.  Zoster vaccine. You may need this after age 5.  Measles, mumps, and rubella (MMR) vaccine. You may need at least one dose of MMR if you were born in  1957 or later. You may also need a second dose.  Pneumococcal 13-valent conjugate (PCV13) vaccine. You may need this if you have certain conditions and were not previously vaccinated.  Pneumococcal polysaccharide (PPSV23) vaccine. You may need one or two doses if you smoke cigarettes or if you have certain conditions.  Meningococcal vaccine. You may need this if you have certain  conditions.  Hepatitis A vaccine. You may need this if you have certain conditions or if you travel or work in places where you may be exposed to hepatitis A.  Hepatitis B vaccine. You may need this if you have certain conditions or if you travel or work in places where you may be exposed to hepatitis B.  Haemophilus influenzae type b (Hib) vaccine. You may need this if you have certain conditions.  Talk to your health care provider about which screenings and vaccines you need and how often you need them. This information is not intended to replace advice given to you by your health care provider. Make sure you discuss any questions you have with your health care provider. Document Released: 09/27/2015 Document Revised: 05/20/2016 Document Reviewed: 07/02/2015 Elsevier Interactive Patient Education  2018 Elsevier Inc.  

## 2017-11-25 NOTE — Progress Notes (Signed)
Subjective:     Marissa Leblanc is a 62 y.o. female and is here for a comprehensive physical exam. The patient reports no problems.  Social History   Socioeconomic History  . Marital status: Divorced    Spouse name: Not on file  . Number of children: Not on file  . Years of education: Not on file  . Highest education level: Not on file  Social Needs  . Financial resource strain: Not on file  . Food insecurity - worry: Not on file  . Food insecurity - inability: Not on file  . Transportation needs - medical: Not on file  . Transportation needs - non-medical: Not on file  Occupational History  . Occupation: [employer]    Employer: [employer]  Tobacco Use  . Smoking status: Never Smoker  . Smokeless tobacco: Never Used  Substance and Sexual Activity  . Alcohol use: Yes    Alcohol/week: 0.0 oz    Comment: rare  . Drug use: No  . Sexual activity: Not Currently    Partners: Male  Other Topics Concern  . Not on file  Social History Narrative   Exercise-- no---but she will start   Health Maintenance  Topic Date Due  . Hepatitis C Screening  [birth year]  . PNEUMOCOCCAL POLYSACCHARIDE VACCINE (1) 05/29/1958  . HIV Screening  05/30/1971  . TETANUS/TDAP  07/12/2016  . COLONOSCOPY  09/23/2016  . PAP SMEAR  02/07/2017  . HEMOGLOBIN A1C  07/24/2017  . FOOT EXAM  01/21/2018  . OPHTHALMOLOGY EXAM  03/08/2018  . MAMMOGRAM  08/21/2019  . INFLUENZA VACCINE  Completed    The following portions of the patient's history were reviewed and updated as appropriate:  She  has a past medical history of Hypertension. She does not have any pertinent problems on file. She  has a past surgical history that includes Tear duct probing. Her family history includes Angina in her maternal grandmother; Diabetes in her sister and unknown relative; Heart attack in her maternal grandmother; Hypertension in her father, mother, and sister; Stroke in her father. She  reports that  has never smoked. she  has never used smokeless tobacco. She reports that she drinks alcohol. She reports that she does not use drugs. She has a current medication list which includes the following prescription(s): accu-chek fastclix lancets, accu-chek guide, fluticasone, levocetirizine, perindopril, and triamterene-hydrochlorothiazide. Current Outpatient Medications on File Prior to Visit  Medication Sig Dispense Refill  . fluticasone (FLONASE) 50 MCG/ACT nasal spray Place 2 sprays into both nostrils daily. 16 g 6   No current facility-administered medications on file prior to visit.    [name] allergic to penicillins..  Review of Systems Review of Systems  Constitutional: Negative for activity change, appetite change and fatigue.  HENT: Negative for hearing loss, congestion, tinnitus and ear discharge.  dentist [id number] Eyes: Negative for visual disturbance (see optho q1y -- vision corrected to 20/20 with glasses).  Respiratory: Negative for cough, chest tightness and shortness of breath.   Cardiovascular: Negative for chest pain, palpitations and leg swelling.  Gastrointestinal: Negative for abdominal pain, diarrhea, constipation and abdominal distention.  Genitourinary: Negative for urgency, frequency, decreased urine volume and difficulty urinating.  Musculoskeletal: Negative for back pain, arthralgias and gait problem.  Skin: Negative for color change, pallor and rash.  Neurological: Negative for dizziness, light-headedness, numbness and headaches.  Hematological: Negative for adenopathy. Does not bruise/bleed easily.  Psychiatric/Behavioral: Negative for suicidal ideas, confusion, sleep disturbance, self-injury, dysphoric mood, decreased concentration and agitation.  Objective:    BP (!) 118/58 (BP Location: Left Arm, Patient Position: Sitting, Cuff Size: Normal)   Pulse 78   Temp 98.4 F (36.9 C) (Oral)   Resp 18   Ht 5\' 6"  (1.676 m)   Wt 230 lb 3.2 oz (104.4 kg)   LMP 10/20/2010   SpO2 96%    BMI 37.16 kg/m  General appearance: alert, cooperative, appears stated age and no distress Head: Normocephalic, without obvious abnormality, atraumatic Eyes: conjunctivae/corneas clear. PERRL, EOM's intact. Fundi benign. Ears: normal TM's and external ear canals both ears Nose: Nares normal. Septum midline. Mucosa normal. No drainage or sinus tenderness. Throat: lips, mucosa, and tongue normal; teeth and gums normal Neck: no adenopathy, no carotid bruit, no JVD, supple, symmetrical, trachea midline and thyroid not enlarged, symmetric, no tenderness/mass/nodules Back: symmetric, no curvature. ROM normal. No CVA tenderness. Lungs: clear to auscultation bilaterally Breasts: normal appearance, no masses or tenderness Heart: regular rate and rhythm, S1, S2 normal, no murmur, click, rub or gallop Abdomen: soft, non-tender; bowel sounds normal; no masses,  no organomegaly Pelvic: cervix normal in appearance, external genitalia normal, no adnexal masses or tenderness, no cervical motion tenderness, rectovaginal septum normal, uterus normal size, shape, and consistency, vagina normal without discharge and pap done, rectal heme neg brown stool Extremities: extremities normal, atraumatic, no cyanosis or edema Pulses: 2+ and symmetric Skin: Skin color, texture, turgor normal. No rashes or lesions Lymph nodes: Cervical, supraclavicular, and axillary nodes normal. Neurologic: Alert and oriented X 3, normal strength and tone. Normal symmetric reflexes. Normal coordination and gait    Assessment:    Healthy female exam.      Plan:    ghm utd Check labs  See After Visit Summary for Counseling Recommendations    1. Essential hypertension Well controlled, no changes to meds. Encouraged heart healthy diet such as the DASH diet and exercise as tolerated.  - perindopril (ACEON) 4 MG tablet; TAKE 1 AND 1/2 TABLETS  Dispense: 135 tablet; Refill: 3 - triamterene-hydrochlorothiazide (MAXZIDE-25) 37.5-25 MG  tablet; Take 1 tablet by mouth daily.  Dispense: 90 tablet; Refill: 0  2. Acute serous otitis media of left ear, recurrence not specified con't flonase rto prn  - levocetirizine (XYZAL) 5 MG tablet; Take 1 tablet (5 mg total) by mouth every evening.  Dispense: 30 tablet; Refill: 5  3. DM (diabetes mellitus) type II uncontrolled, periph vascular disorder (HCC) Not checking glucose - Hemoglobin A1c - Comprehensive metabolic panel - CBC with Differential/Platelet - Lipid panel - ACCU-CHEK FASTCLIX LANCETS MISC; Check blood sugar twice daily. Dx:E11.9  Dispense: 100 each; Refill: 12 - ACCU-CHEK GUIDE test strip; Check blood sugar twice daily. Dx: E11.9  Dispense: 100 each; Refill: 12  4. Preventative health care ghm utd Check labs  - Comprehensive metabolic panel - CBC with Differential/Platelet - Lipid panel - TSH - Cytology - PAP

## 2017-11-29 LAB — CYTOLOGY - PAP
Diagnosis: NEGATIVE
HPV: NOT DETECTED

## 2017-12-07 ENCOUNTER — Other Ambulatory Visit: Payer: Self-pay | Admitting: *Deleted

## 2017-12-07 DIAGNOSIS — R7989 Other specified abnormal findings of blood chemistry: Secondary | ICD-10-CM

## 2017-12-08 ENCOUNTER — Other Ambulatory Visit (INDEPENDENT_AMBULATORY_CARE_PROVIDER_SITE_OTHER): Payer: BLUE CROSS/BLUE SHIELD

## 2017-12-08 ENCOUNTER — Telehealth: Payer: Self-pay | Admitting: Family Medicine

## 2017-12-08 DIAGNOSIS — R7989 Other specified abnormal findings of blood chemistry: Secondary | ICD-10-CM | POA: Diagnosis not present

## 2017-12-08 NOTE — Telephone Encounter (Signed)
Patient dropped off Paper work. Patient wants it fax to number on file and mailed copy back to her home address.

## 2017-12-08 NOTE — Addendum Note (Signed)
Addended by: Verdie ShireBAYNES, ANGELA M on: 12/08/2017 08:42 AM   Modules accepted: Orders

## 2017-12-09 LAB — PATHOLOGIST SMEAR REVIEW

## 2017-12-10 NOTE — Telephone Encounter (Signed)
Received Physician Screening Collection Form from [employer], called patient to obtain pt's waist circumference and also if she was fasting; forwarded to provider/SLS 03/29

## 2017-12-13 NOTE — Telephone Encounter (Signed)
Completed paperwork faxed to [phone], original mailed to pt's house at their request; copy made for scan/SLS 04/01

## 2017-12-15 ENCOUNTER — Other Ambulatory Visit: Payer: Self-pay | Admitting: *Deleted

## 2017-12-15 DIAGNOSIS — E611 Iron deficiency: Secondary | ICD-10-CM

## 2017-12-17 ENCOUNTER — Other Ambulatory Visit: Payer: Self-pay | Admitting: Family Medicine

## 2017-12-17 DIAGNOSIS — I10 Essential (primary) hypertension: Secondary | ICD-10-CM

## 2017-12-20 ENCOUNTER — Other Ambulatory Visit (INDEPENDENT_AMBULATORY_CARE_PROVIDER_SITE_OTHER): Payer: BLUE CROSS/BLUE SHIELD

## 2017-12-20 DIAGNOSIS — E611 Iron deficiency: Secondary | ICD-10-CM

## 2017-12-20 LAB — CBC WITH DIFFERENTIAL/PLATELET
BASOS ABS: 0.1 10*3/uL (ref 0.0–0.1)
BASOS PCT: 1 % (ref 0.0–3.0)
EOS PCT: 2.8 % (ref 0.0–5.0)
Eosinophils Absolute: 0.1 10*3/uL (ref 0.0–0.7)
HEMATOCRIT: 39 % (ref 36.0–46.0)
Hemoglobin: 12.6 g/dL (ref 12.0–15.0)
LYMPHS ABS: 1.7 10*3/uL (ref 0.7–4.0)
LYMPHS PCT: 33.1 % (ref 12.0–46.0)
MCHC: 32.3 g/dL (ref 30.0–36.0)
MCV: 63.4 fl — ABNORMAL LOW (ref 78.0–100.0)
MONOS PCT: 4.8 % (ref 3.0–12.0)
Monocytes Absolute: 0.2 10*3/uL (ref 0.1–1.0)
NEUTROS ABS: 2.9 10*3/uL (ref 1.4–7.7)
Neutrophils Relative %: 58.3 % (ref 43.0–77.0)
PLATELETS: 283 10*3/uL (ref 150.0–400.0)
RBC: 6.16 Mil/uL — ABNORMAL HIGH (ref 3.87–5.11)
RDW: 14.9 % (ref 11.5–15.5)
WBC: 5 10*3/uL (ref 4.0–10.5)

## 2017-12-20 LAB — IBC PANEL
Iron: 106 ug/dL (ref 42–145)
SATURATION RATIOS: 33.7 % (ref 20.0–50.0)
Transferrin: 225 mg/dL (ref 212.0–360.0)

## 2017-12-20 LAB — FERRITIN: Ferritin: 158.3 ng/mL (ref 10.0–291.0)

## 2017-12-20 LAB — VITAMIN B12: Vitamin B-12: 232 pg/mL (ref 211–911)

## 2017-12-24 ENCOUNTER — Ambulatory Visit: Payer: Self-pay | Admitting: *Deleted

## 2017-12-24 NOTE — Telephone Encounter (Signed)
Pt having complaints of a headache since Tuesday that worsens with standing. Pt rates the pain at 2 with sitting and states it increases to 10 with standing. Pt states that the pain goes from both sides of her temples to the back of her head to the beginning of her neck Pt states she also experienced chills and body aches on Monday night but denies these symptoms currently.Pt denies any other symptoms at this time. Pt advised to seek treatment in the ED or Urgent Care if symptoms worsen before appt. Pt verbalized understanding. Pt scheduled for appt on 4/13 .  Reason for Disposition . [1] MODERATE headache (e.g., interferes with normal activities) AND [2] present > 24 hours AND [3] unexplained  (Exceptions: analgesics not tried, typical migraine, or headache part of viral illness)  Answer Assessment - Initial Assessment Questions 1. LOCATION: "Where does it hurt?"      From temples on both sides to back of head to the beginning of neck 2. ONSET: "When did the headache start?" (Minutes, hours or days)      Tuesday 3. PATTERN: "Does the pain come and go, or has it been constant since it started?"     Comes and goes with standing, gets worse with standing 4. SEVERITY: "How bad is the pain?" and "What does it keep you from doing?"  (e.g., Scale 1-10; mild, moderate, or severe)   - MILD (1-3): doesn't interfere with normal activities    - MODERATE (4-7): interferes with normal activities or awakens from sleep    - SEVERE (8-10): excruciating pain, unable to do any normal activities        Pain one with sitting and 10 with standing up 5. RECURRENT SYMPTOM: "Have you ever had headaches before?" If so, ask: "When was the last time?" and "What happened that time?"      No 6. CAUSE: "What do you think is causing the headache?"     unsure 7. MIGRAINE: "Have you been diagnosed with migraine headaches?" If so, ask: "Is this headache similar?"      No 8. HEAD INJURY: "Has there been any recent injury to the  head?"      No 9. OTHER SYMPTOMS: "Do you have any other symptoms?" (fever, stiff neck, eye pain, sore throat, cold symptoms)     No not since Wed, had chills and aches and broke into a sweat but non now 10. PREGNANCY: "Is there any chance you are pregnant?" "When was your last menstrual period?"       n/a  Protocols used: HEADACHE-A-AH

## 2017-12-25 ENCOUNTER — Ambulatory Visit (INDEPENDENT_AMBULATORY_CARE_PROVIDER_SITE_OTHER): Payer: BLUE CROSS/BLUE SHIELD | Admitting: Family Medicine

## 2017-12-25 ENCOUNTER — Encounter: Payer: Self-pay | Admitting: Family Medicine

## 2017-12-25 VITALS — BP 132/76 | HR 69 | Temp 98.6°F | Ht 66.0 in | Wt 233.0 lb

## 2017-12-25 DIAGNOSIS — R51 Headache: Secondary | ICD-10-CM | POA: Diagnosis not present

## 2017-12-25 DIAGNOSIS — R519 Headache, unspecified: Secondary | ICD-10-CM

## 2017-12-25 DIAGNOSIS — J329 Chronic sinusitis, unspecified: Secondary | ICD-10-CM | POA: Diagnosis not present

## 2017-12-25 DIAGNOSIS — J310 Chronic rhinitis: Secondary | ICD-10-CM

## 2017-12-25 NOTE — Progress Notes (Signed)
    HPI:  Using dictation device. Unfortunately this device frequently misinterprets words/phrases.   Saturday clinic acute visit for :  HA with ? Allergies or URI illness: -x 3 days -congestion, L ear pain, HA, body aches, chills -thought it might be allergies so tried antihistamine -doing better today -denies fever, vision changes, neck soreness/stiffness, weakness, numbness, malaise, tooth/sinus pain  ROS: See pertinent positives and negatives per HPI.  Past Medical History:  Diagnosis Date  . Hypertension     Past Surgical History:  Procedure Laterality Date  . TEAR DUCT PROBING      Family History  Problem Relation Age of Onset  . Hypertension Sister   . Diabetes Sister   . Hypertension Mother   . Hypertension Father   . Stroke Father   . Diabetes Unknown   . Angina Maternal Grandmother   . Heart attack Maternal Grandmother     SOCIAL HX:    Current Outpatient Medications:  .  ACCU-CHEK FASTCLIX LANCETS MISC, Check blood sugar twice daily. Dx:E11.9, Disp: 100 each, Rfl: 12 .  ACCU-CHEK GUIDE test strip, Check blood sugar twice daily. Dx: E11.9, Disp: 100 each, Rfl: 12 .  fluticasone (FLONASE) 50 MCG/ACT nasal spray, Place 2 sprays into both nostrils daily., Disp: 16 g, Rfl: 6 .  levocetirizine (XYZAL) 5 MG tablet, Take 1 tablet (5 mg total) by mouth every evening., Disp: 30 tablet, Rfl: 5 .  perindopril (ACEON) 4 MG tablet, TAKE 1 AND 1/2 TABLETS, Disp: 135 tablet, Rfl: 3 .  perindopril (ACEON) 4 MG tablet, TAKE 1 AND 1/2 TABLET BY MOUTH EVERY DAY, Disp: 135 tablet, Rfl: 1 .  triamterene-hydrochlorothiazide (MAXZIDE-25) 37.5-25 MG tablet, Take 1 tablet by mouth daily., Disp: 90 tablet, Rfl: 0  EXAM:  Vitals:   12/25/17 1030  BP: 132/76  Pulse: 69  Temp: 98.6 F (37 C)  SpO2: 98%    Body mass index is 37.61 kg/m.  GENERAL: vitals reviewed and listed above, alert, oriented, appears well hydrated and in no acute distress  HEENT: atraumatic,  conjunttiva clear, no obvious abnormalities on inspection of external nose and ears, normal appearance of ear canals and TMs except for clear effusion L, clear nasal congestion, mild post oropharyngeal erythema with PND, no tonsillar edema or exudate, no sinus TTP  NECK: no obvious masses on inspection  LUNGS: clear to auscultation bilaterally, no wheezes, rales or rhonchi, good air movement  CV: HRRR, no peripheral edema  MS: moves all extremities without noticeable abnormality  PSYCH/NEURO: pleasant and cooperative, no obvious depression or anxiety, CN II-XII grossly intact, finger to nose normal, speech and thought processing grossly intact  ASSESSMENT AND PLAN:  Discussed the following assessment and plan:  Rhinosinusitis  Acute nonintractable headache, unspecified headache type  -seems most symptoms have resolved and no headache now -suspect AR vs VURI that is resolving, discussed symptomatic and OTC treatment options, adding flonase consistently for 3 weeks -Patient advised to return or notify a doctor if symptoms worsen, new concerns arise or persist longer. -declined avs There are no Patient Instructions on file for this visit.  Terressa KoyanagiHannah R Kim, DO

## 2018-03-01 ENCOUNTER — Other Ambulatory Visit: Payer: Self-pay | Admitting: Family Medicine

## 2018-03-01 DIAGNOSIS — I10 Essential (primary) hypertension: Secondary | ICD-10-CM

## 2018-04-05 DIAGNOSIS — E119 Type 2 diabetes mellitus without complications: Secondary | ICD-10-CM | POA: Diagnosis not present

## 2018-04-05 LAB — HM DIABETES EYE EXAM

## 2018-04-25 ENCOUNTER — Encounter: Payer: Self-pay | Admitting: *Deleted

## 2018-06-14 ENCOUNTER — Other Ambulatory Visit: Payer: Self-pay | Admitting: Family Medicine

## 2018-06-14 DIAGNOSIS — I10 Essential (primary) hypertension: Secondary | ICD-10-CM

## 2018-12-15 ENCOUNTER — Encounter: Payer: Self-pay | Admitting: Family Medicine

## 2018-12-15 ENCOUNTER — Other Ambulatory Visit: Payer: Self-pay

## 2018-12-15 ENCOUNTER — Ambulatory Visit (INDEPENDENT_AMBULATORY_CARE_PROVIDER_SITE_OTHER): Payer: BLUE CROSS/BLUE SHIELD | Admitting: Family Medicine

## 2018-12-15 DIAGNOSIS — R739 Hyperglycemia, unspecified: Secondary | ICD-10-CM

## 2018-12-15 DIAGNOSIS — Z0000 Encounter for general adult medical examination without abnormal findings: Secondary | ICD-10-CM | POA: Diagnosis not present

## 2018-12-15 DIAGNOSIS — I10 Essential (primary) hypertension: Secondary | ICD-10-CM | POA: Diagnosis not present

## 2018-12-15 MED ORDER — TRIAMTERENE-HCTZ 37.5-25 MG PO TABS: 1.0000 | ORAL_TABLET | Freq: Every day | ORAL | 3 refills | Status: DC

## 2018-12-15 MED ORDER — PERINDOPRIL ERBUMINE 4 MG PO TABS: ORAL_TABLET | ORAL | 3 refills | Status: DC

## 2018-12-15 MED ORDER — BLOOD GLUCOSE MONITOR KIT: PACK | 0 refills | Status: AC

## 2018-12-15 NOTE — Progress Notes (Signed)
Virtual Visit via Video Note  I connected with Marissa Leblanc on 12/15/18 at  8:15 AM EDT by a video enabled telemedicine application and verified that I am speaking with the correct person using two identifiers.   I discussed the limitations of evaluation and management by telemedicine and the availability of in person appointments. The patient expressed understanding and agreed to proceed.  History of Present Illness: Pt is home needing refills of her meds.  She has no complaints and will be in for cpe and labs later    Observations/Objective: 232 lbs  69f 6 in  Afebrile   Assessment and Plan: 1. Essential hypertension Well controlled, no changes to meds. Encouraged heart healthy diet such as the DASH diet and exercise as tolerated. --- per pt  - triamterene-hydrochlorothiazide (MAXZIDE-25) 37.5-25 MG tablet; Take 1 tablet by mouth daily.  Dispense: 90 tablet; Refill: 3 - perindopril (ACEON) 4 MG tablet; TAKE 1 AND 1/2 TABLETS  Dispense: 135 tablet; Refill: 3 - Lipid panel - CBC with Differential/Platelet - Comprehensive metabolic panel  2. Hyperglycemia Glucometer broken-- she will get another one -- con't with simple sugars and starches  - blood glucose meter kit and supplies KIT; Dispense based on patient and insurance preference. Use up to four times daily as directed. (FOR ICD-9 250.00, 250.01).  Dispense: 1 each; Refill: 0 - Hemoglobin A1c - Comprehensive metabolic panel  3. Preventative health care cpe in July - TSH - Lipid panel - CBC with Differential/Platelet - Hemoglobin A1c - Comprehensive metabolic panel   Follow Up Instructions:    I discussed the assessment and treatment plan with the patient. The patient was provided an opportunity to ask questions and all were answered. The patient agreed with the plan and demonstrated an understanding of the instructions.   The patient was advised to call back or seek an in-person evaluation if the symptoms worsen or if  the condition fails to improve as anticipated.  I provided 25 minutes of non-face-to-face time during this encounter.-->50% discussing meds,  schduling cpe and    Marissa Held DO

## 2018-12-16 ENCOUNTER — Telehealth: Payer: Self-pay | Admitting: Family Medicine

## 2018-12-16 NOTE — Telephone Encounter (Signed)
Pt is scheduled Jan 20, 2019 for lab. Pt wanted to come in the month, pt did not want to come to soon. Done

## 2018-12-16 NOTE — Telephone Encounter (Signed)
-----   Message from Blima Ledger, CMA sent at 12/15/2018  2:53 PM EDT ----- Lab only appointment as soon as she can. ----- Message ----- From: Donato Schultz, DO Sent: 12/15/2018   8:27 AM EDT To: Blima Ledger, CMA  Needs lab app

## 2018-12-19 ENCOUNTER — Other Ambulatory Visit: Payer: Self-pay | Admitting: *Deleted

## 2018-12-19 DIAGNOSIS — E1165 Type 2 diabetes mellitus with hyperglycemia: Principal | ICD-10-CM

## 2018-12-19 DIAGNOSIS — E1151 Type 2 diabetes mellitus with diabetic peripheral angiopathy without gangrene: Secondary | ICD-10-CM

## 2018-12-19 DIAGNOSIS — IMO0002 Reserved for concepts with insufficient information to code with codable children: Secondary | ICD-10-CM

## 2018-12-19 MED ORDER — ACCU-CHEK FASTCLIX LANCETS MISC: 1 refills | Status: DC

## 2019-01-02 ENCOUNTER — Encounter: Payer: BLUE CROSS/BLUE SHIELD | Admitting: Family Medicine

## 2019-01-20 ENCOUNTER — Other Ambulatory Visit (INDEPENDENT_AMBULATORY_CARE_PROVIDER_SITE_OTHER): Payer: BLUE CROSS/BLUE SHIELD

## 2019-01-20 ENCOUNTER — Telehealth: Payer: Self-pay | Admitting: Family Medicine

## 2019-01-20 ENCOUNTER — Other Ambulatory Visit: Payer: Self-pay

## 2019-01-20 DIAGNOSIS — I10 Essential (primary) hypertension: Secondary | ICD-10-CM

## 2019-01-20 DIAGNOSIS — Z0000 Encounter for general adult medical examination without abnormal findings: Secondary | ICD-10-CM | POA: Diagnosis not present

## 2019-01-20 DIAGNOSIS — R739 Hyperglycemia, unspecified: Secondary | ICD-10-CM

## 2019-01-20 LAB — CBC WITH DIFFERENTIAL/PLATELET
Basophils Absolute: 0 10*3/uL (ref 0.0–0.1)
Basophils Relative: 0.8 % (ref 0.0–3.0)
Eosinophils Absolute: 0.1 10*3/uL (ref 0.0–0.7)
Eosinophils Relative: 2 % (ref 0.0–5.0)
HCT: 38.2 % (ref 36.0–46.0)
Hemoglobin: 12.2 g/dL (ref 12.0–15.0)
Lymphocytes Relative: 27.6 % (ref 12.0–46.0)
Lymphs Abs: 1.5 10*3/uL (ref 0.7–4.0)
MCHC: 32 g/dL (ref 30.0–36.0)
MCV: 63.8 fl — ABNORMAL LOW (ref 78.0–100.0)
Monocytes Absolute: 0.3 10*3/uL (ref 0.1–1.0)
Monocytes Relative: 5.5 % (ref 3.0–12.0)
Neutro Abs: 3.4 10*3/uL (ref 1.4–7.7)
Neutrophils Relative %: 64.1 % (ref 43.0–77.0)
Platelets: 270 10*3/uL (ref 150.0–400.0)
RBC: 5.99 Mil/uL — ABNORMAL HIGH (ref 3.87–5.11)
RDW: 15.4 % (ref 11.5–15.5)
WBC: 5.4 10*3/uL (ref 4.0–10.5)

## 2019-01-20 LAB — COMPREHENSIVE METABOLIC PANEL
ALT: 10 U/L (ref 0–35)
AST: 10 U/L (ref 0–37)
Albumin: 4.3 g/dL (ref 3.5–5.2)
Alkaline Phosphatase: 59 U/L (ref 39–117)
BUN: 15 mg/dL (ref 6–23)
CO2: 31 mEq/L (ref 19–32)
Calcium: 8.8 mg/dL (ref 8.4–10.5)
Chloride: 100 mEq/L (ref 96–112)
Creatinine, Ser: 0.88 mg/dL (ref 0.40–1.20)
GFR: 64.97 mL/min (ref >60.00)
Glucose, Bld: 149 mg/dL — ABNORMAL HIGH (ref 70–99)
Potassium: 3.8 mEq/L (ref 3.5–5.1)
Sodium: 139 mEq/L (ref 135–145)
Total Bilirubin: 1.6 mg/dL — ABNORMAL HIGH (ref 0.2–1.2)
Total Protein: 6.6 g/dL (ref 6.0–8.3)

## 2019-01-20 LAB — LIPID PANEL
Cholesterol: 150 mg/dL (ref 0–200)
HDL: 45.3 mg/dL (ref >39.00)
LDL Cholesterol: 88 mg/dL (ref 0–99)
NonHDL: 104.24
Total CHOL/HDL Ratio: 3
Triglycerides: 80 mg/dL (ref 0.0–149.0)
VLDL: 16 mg/dL (ref 0.0–40.0)

## 2019-01-20 LAB — HEMOGLOBIN A1C: Hgb A1c MFr Bld: 6.8 % — ABNORMAL HIGH (ref 4.6–6.5)

## 2019-01-20 LAB — TSH: TSH: 2.91 u[IU]/mL (ref 0.35–4.50)

## 2019-01-20 NOTE — Addendum Note (Signed)
Addended by: Mervin Kung A on: 01/20/2019 08:09 AM   Modules accepted: Orders

## 2019-01-20 NOTE — Telephone Encounter (Signed)
Pt dropped off document to be filled out by provider (2 pages- [employer] Physician Form) Pt would like to have document faxed to [phone] when document ready. Document put at front office tray under providers name.

## 2019-01-23 ENCOUNTER — Other Ambulatory Visit: Payer: Self-pay | Admitting: Family Medicine

## 2019-01-23 DIAGNOSIS — I10 Essential (primary) hypertension: Secondary | ICD-10-CM

## 2019-01-23 DIAGNOSIS — E1165 Type 2 diabetes mellitus with hyperglycemia: Secondary | ICD-10-CM

## 2019-03-08 ENCOUNTER — Ambulatory Visit: Payer: Self-pay

## 2019-03-08 NOTE — Telephone Encounter (Signed)
Patient called and says that she was exposed to someone who tested positive for covid. She says he was visiting her sister at [facility], [place] and on 02/27/19, the neighbor came over, she opened the door and the neighbor was there about 10 minutes all in the same room. The neighbor is who tested positive. She says she called the health department and they advised her to call us for testing. She denies any symptoms. I called the office and spoke to [place], [facility] who asked to speak to the patient, the call was connected successfully.  Answer Assessment - Initial Assessment Questions 1. CLOSE CONTACT: "Who is the person with the confirmed or suspected COVID-19 infection that you were exposed to?"     Exposed to someone who tested positivie 2. PLACE of CONTACT: "Where were you when you were exposed to COVID-19?" (e.g., home, school, medical waiting room; which city?)     Sister home at [facility], [place] 3. TYPE of CONTACT: "How much contact was there?" (e.g., sitting next to, live in same house, work in same office, same building)     Visited for a few minutes 4. DURATION of CONTACT: "How long were you in contact with the COVID-19 patient?" (e.g., a few seconds, passed by person, a few minutes, live with the patient)     Maybe 10 minutes 5. DATE of CONTACT: "When did you have contact with a COVID-19 patient?" (e.g., how many days ago)     02/27/19 6. TRAVEL: "Have you traveled out of the country recently?" If so, "When and where?"     * Also ask about out-of-state travel, since the CDC has identified some high-risk cities for community spread in the [place].     * Note: Travel becomes less relevant if there is widespread community transmission where the patient lives.     No 7. COMMUNITY SPREAD: "Are there lots of cases of COVID-19 (community spread) where you live?" (See public health department website, if unsure)       No 8. SYMPTOMS: "Do you have any symptoms?" (e.g., fever, cough, breathing  difficulty)     No 9. PREGNANCY OR POSTPARTUM: "Is there any chance you are pregnant?" "When was your last menstrual period?" "Did you deliver in the last 2 weeks?"     No 10. HIGH RISK: "Do you have any heart or lung problems? Do you have a weak immune system?" (e.g., CHF, COPD, asthma, HIV positive, chemotherapy, renal failure, diabetes mellitus, sickle cell anemia)       Diabetes  Protocols used: CORONAVIRUS (COVID-19) EXPOSURE-A-AH

## 2019-03-09 ENCOUNTER — Encounter: Payer: Self-pay | Admitting: Family Medicine

## 2019-03-09 ENCOUNTER — Telehealth: Payer: Self-pay | Admitting: Family Medicine

## 2019-03-09 ENCOUNTER — Other Ambulatory Visit: Payer: BLUE CROSS/BLUE SHIELD

## 2019-03-09 ENCOUNTER — Other Ambulatory Visit: Payer: Self-pay

## 2019-03-09 ENCOUNTER — Ambulatory Visit (INDEPENDENT_AMBULATORY_CARE_PROVIDER_SITE_OTHER): Payer: BLUE CROSS/BLUE SHIELD | Admitting: Family Medicine

## 2019-03-09 DIAGNOSIS — Z20822 Contact with and (suspected) exposure to covid-19: Secondary | ICD-10-CM

## 2019-03-09 DIAGNOSIS — R6889 Other general symptoms and signs: Secondary | ICD-10-CM | POA: Diagnosis not present

## 2019-03-09 DIAGNOSIS — Z20828 Contact with and (suspected) exposure to other viral communicable diseases: Secondary | ICD-10-CM

## 2019-03-09 NOTE — Assessment & Plan Note (Addendum)
Pt to be tested  She will quarantine herself

## 2019-03-09 NOTE — Telephone Encounter (Signed)
Spoke with patient, scheduled her for COVID 19 testing today at [facility] at 12:45 pm.  Testing protocol reviewed.

## 2019-03-09 NOTE — Progress Notes (Signed)
Virtual Visit via Video Note  I connected with Marissa Leblanc on 03/09/19 at 11:30 AM EDT by a video enabled telemedicine application and verified that I am speaking with the correct person using two identifiers.  Location: Patient: home  Provider: home    I discussed the limitations of evaluation and management by telemedicine and the availability of in person appointments. The patient expressed understanding and agreed to proceed.  History of Present Illness: Pt is home and c/o exposure to covid' She has no symptoms but would like to be tested    Observations/Objective: No vitals obtained  Pt in NAD  Assessment and Plan: .1. Close Exposure to Covid-19 Virus Pt to be tested  She will quarantine herself     Follow Up Instructions:    I discussed the assessment and treatment plan with the patient. The patient was provided an opportunity to ask questions and all were answered. The patient agreed with the plan and demonstrated an understanding of the instructions.   The patient was advised to call back or seek an in-person evaluation if the symptoms worsen or if the condition fails to improve as anticipated.  I provided 15 minutes of non-face-to-face time during this encounter.   Ann Held, DO

## 2019-03-09 NOTE — Telephone Encounter (Signed)
Virtual visit scheduled.  

## 2019-03-09 NOTE — Telephone Encounter (Signed)
Pt was exposed to covid at [facility] 6/15 and person found out they were + on Monday

## 2019-03-14 ENCOUNTER — Telehealth: Payer: Self-pay

## 2019-03-14 NOTE — Telephone Encounter (Signed)
-----   Message from Ann Held, DO sent at 03/14/2019  7:22 AM EDT ----- Pt was supposed to get tested for covid on 25th ----- Message ----- From: SYSTEM Sent: 03/14/2019  12:04 AM EDT To: Ann Held, DO

## 2019-03-15 LAB — NOVEL CORONAVIRUS, NAA: SARS-CoV-2, NAA: NOT DETECTED

## 2019-03-27 ENCOUNTER — Encounter: Payer: BLUE CROSS/BLUE SHIELD | Admitting: Family Medicine

## 2019-06-05 ENCOUNTER — Other Ambulatory Visit: Payer: Self-pay | Admitting: Family Medicine

## 2019-06-05 DIAGNOSIS — I10 Essential (primary) hypertension: Secondary | ICD-10-CM

## 2019-07-03 DIAGNOSIS — E119 Type 2 diabetes mellitus without complications: Secondary | ICD-10-CM | POA: Diagnosis not present

## 2019-07-03 LAB — HM DIABETES EYE EXAM

## 2019-08-14 ENCOUNTER — Encounter: Payer: Self-pay | Admitting: Family Medicine

## 2019-08-14 ENCOUNTER — Other Ambulatory Visit: Payer: Self-pay

## 2019-08-14 ENCOUNTER — Ambulatory Visit (INDEPENDENT_AMBULATORY_CARE_PROVIDER_SITE_OTHER): Payer: BLUE CROSS/BLUE SHIELD | Admitting: Family Medicine

## 2019-08-14 VITALS — BP 132/67 | HR 65 | Temp 96.5°F | Resp 12 | Ht 66.0 in | Wt 241.4 lb

## 2019-08-14 DIAGNOSIS — Z23 Encounter for immunization: Secondary | ICD-10-CM | POA: Diagnosis not present

## 2019-08-14 DIAGNOSIS — Z0000 Encounter for general adult medical examination without abnormal findings: Secondary | ICD-10-CM | POA: Diagnosis not present

## 2019-08-14 DIAGNOSIS — E2839 Other primary ovarian failure: Secondary | ICD-10-CM

## 2019-08-14 DIAGNOSIS — E1165 Type 2 diabetes mellitus with hyperglycemia: Secondary | ICD-10-CM

## 2019-08-14 DIAGNOSIS — Z1211 Encounter for screening for malignant neoplasm of colon: Secondary | ICD-10-CM

## 2019-08-14 DIAGNOSIS — I10 Essential (primary) hypertension: Secondary | ICD-10-CM

## 2019-08-14 DIAGNOSIS — Z1159 Encounter for screening for other viral diseases: Secondary | ICD-10-CM | POA: Diagnosis not present

## 2019-08-14 NOTE — Patient Instructions (Signed)

## 2019-08-14 NOTE — Progress Notes (Signed)
Subjective:     Marissa Leblanc is a 63 y.o. female and is here for a comprehensive physical exam. The patient reports no problems.  Social History   Socioeconomic History  . Marital status: Divorced    Spouse name: Not on file  . Number of children: Not on file  . Years of education: Not on file  . Highest education level: Not on file  Occupational History  . Occupation: [employer]    Employer: [employer]  Social Needs  . Financial resource strain: Not on file  . Food insecurity    Worry: Not on file    Inability: Not on file  . Transportation needs    Medical: Not on file    Non-medical: Not on file  Tobacco Use  . Smoking status: Never Smoker  . Smokeless tobacco: Never Used  Substance and Sexual Activity  . Alcohol use: Yes    Alcohol/week: 0.0 standard drinks    Comment: rare  . Drug use: No  . Sexual activity: Not Currently    Partners: Male  Lifestyle  . Physical activity    Days per week: Not on file    Minutes per session: Not on file  . Stress: Not on file  Relationships  . Social Herbalist on phone: Not on file    Gets together: Not on file    Attends religious service: Not on file    Active member of club or organization: Not on file    Attends meetings of clubs or organizations: Not on file    Relationship status: Not on file  . Intimate partner violence    Fear of current or ex partner: Not on file    Emotionally abused: Not on file    Physically abused: Not on file    Forced sexual activity: Not on file  Other Topics Concern  . Not on file  Social History Narrative   Exercise-- no---but she will start   Health Maintenance  Topic Date Due  . Hepatitis C Screening  Dec 19, 1955  . HIV Screening  05/30/1971  . COLONOSCOPY  09/23/2016  . FOOT EXAM  01/21/2018  . INFLUENZA VACCINE  04/15/2019  . HEMOGLOBIN A1C  07/23/2019  . MAMMOGRAM  08/21/2019  . OPHTHALMOLOGY EXAM  07/02/2020  . PAP SMEAR-Modifier  11/25/2020  . TETANUS/TDAP   11/26/2027  . PNEUMOCOCCAL POLYSACCHARIDE VACCINE AGE 48-64 HIGH RISK  Completed    The following portions of the patient's history were reviewed and updated as appropriate: She  has a past medical history of Hypertension. She does not have any pertinent problems on file. She  has a past surgical history that includes Tear duct probing. Her family history includes Angina in her maternal grandmother; Diabetes in her sister and another family member; Heart attack in her maternal grandmother; Hypertension in her father, mother, and sister; Stroke in her father. She  reports that she has never smoked. She has never used smokeless tobacco. She reports current alcohol use. She reports that she does not use drugs. She has a current medication list which includes the following prescription(s): accu-chek fastclix lancets, accu-chek guide, blood glucose meter kit and supplies, fluticasone, perindopril, and triamterene-hydrochlorothiazide. Current Outpatient Medications on File Prior to Visit  Medication Sig Dispense Refill  . Accu-Chek FastClix Lancets MISC Check blood sugar twice daily. Dx:E11.9 306 each 1  . ACCU-CHEK GUIDE test strip Check blood sugar twice daily. Dx: E11.9 100 each 12  . blood glucose meter  kit and supplies KIT Dispense based on patient and insurance preference. Use up to four times daily as directed. (FOR ICD-9 250.00, 250.01). 1 each 0  . fluticasone (FLONASE) 50 MCG/ACT nasal spray Place 2 sprays into both nostrils daily. 16 g 6  . perindopril (ACEON) 4 MG tablet TAKE 1 AND 1/2 TABLET BY MOUTH EVERY DAY 135 tablet 1  . triamterene-hydrochlorothiazide (MAXZIDE-25) 37.5-25 MG tablet TAKE 1 TABLET BY MOUTH DAILY 90 tablet 3   No current facility-administered medications on file prior to visit.    [name] allergic to penicillins..  Review of Systems Review of Systems  Constitutional: Negative for activity change, appetite change and fatigue.  HENT: Negative for hearing loss,  congestion, tinnitus and ear discharge.  dentist q27mEyes: Negative for visual disturbance (see optho q1y -- vision corrected to 20/20 with glasses).  Respiratory: Negative for cough, chest tightness and shortness of breath.   Cardiovascular: Negative for chest pain, palpitations and leg swelling.  Gastrointestinal: Negative for abdominal pain, diarrhea, constipation and abdominal distention.  Genitourinary: Negative for urgency, frequency, decreased urine volume and difficulty urinating.  Musculoskeletal: Negative for back pain, arthralgias and gait problem.  Skin: Negative for color change, pallor and rash.  Neurological: Negative for dizziness, light-headedness, numbness and headaches.  Hematological: Negative for adenopathy. Does not bruise/bleed easily.  Psychiatric/Behavioral: Negative for suicidal ideas, confusion, sleep disturbance, self-injury, dysphoric mood, decreased concentration and agitation.       Objective:    BP 132/67 (BP Location: Left Arm, Cuff Size: Large)   Pulse 65   Temp (!) 96.5 F (35.8 C) (Temporal)   Resp 12   Ht '5\' 6"'  (1.676 m)   Wt 241 lb 6.4 oz (109.5 kg)   LMP 10/20/2010   SpO2 100%   BMI 38.96 kg/m  General appearance: alert, cooperative, appears stated age and no distress Head: Normocephalic, without obvious abnormality, atraumatic Eyes: conjunctivae/corneas clear. PERRL, EOM's intact. Fundi benign. Ears: normal TM's and external ear canals both ears Nose: Nares normal. Septum midline. Mucosa normal. No drainage or sinus tenderness. Throat: lips, mucosa, and tongue normal; teeth and gums normal Neck: no adenopathy, no carotid bruit, no JVD, supple, symmetrical, trachea midline and thyroid not enlarged, symmetric, no tenderness/mass/nodules Back: symmetric, no curvature. ROM normal. No CVA tenderness. Lungs: clear to auscultation bilaterally Breasts: normal appearance, no masses or tenderness Heart: regular rate and rhythm, S1, S2 normal, no  murmur, click, rub or gallop Abdomen: soft, non-tender; bowel sounds normal; no masses,  no organomegaly Pelvic: not indicated; post-menopausal, no abnormal Pap smears in past Extremities: extremities normal, atraumatic, no cyanosis or edema Pulses: 2+ and symmetric Skin: Skin color, texture, turgor normal. No rashes or lesions Lymph nodes: Cervical, supraclavicular, and axillary nodes normal. Neurologic: Alert and oriented X 3, normal strength and tone. Normal symmetric reflexes. Normal coordination and gait    Assessment:    Healthy female exam.      Plan:     ghm utd Check labs See After Visit Summary for Counseling Recommendations    1. Preventative health care Check labs  - Hemoglobin A1c - CBC with Differential - TSH - Comprehensive metabolic panel - Lipid panel  2. Essential hypertension Well controlled, no changes to meds. Encouraged heart healthy diet such as the DASH diet and exercise as tolerated.  - Hemoglobin A1c - CBC with Differential - TSH - Comprehensive metabolic panel - Lipid panel  3. Type 2 diabetes mellitus with hyperglycemia, without long-term current use of insulin (HCC) Check  labs  - Hemoglobin A1c - Comprehensive metabolic panel - Lipid panel  4. Estrogen deficiency   - DG Bone Density; Future  5. Colon cancer screening   - Ambulatory referral to Gastroenterology  6. Need for hepatitis C screening test   - Hepatitis C antibody  7. Influenza vaccine administered   - Flu Vaccine QUAD 6+ mos PF IM (Fluarix Quad PF)

## 2019-08-15 LAB — LIPID PANEL
Cholesterol: 169 mg/dL (ref 0–200)
HDL: 46.3 mg/dL (ref >39.00)
LDL Cholesterol: 101 mg/dL — ABNORMAL HIGH (ref 0–99)
NonHDL: 122.53
Total CHOL/HDL Ratio: 4
Triglycerides: 107 mg/dL (ref 0.0–149.0)
VLDL: 21.4 mg/dL (ref 0.0–40.0)

## 2019-08-15 LAB — COMPREHENSIVE METABOLIC PANEL
ALT: 12 U/L (ref 0–35)
AST: 12 U/L (ref 0–37)
Albumin: 4.4 g/dL (ref 3.5–5.2)
Alkaline Phosphatase: 55 U/L (ref 39–117)
BUN: 10 mg/dL (ref 6–23)
CO2: 30 mEq/L (ref 19–32)
Calcium: 9.2 mg/dL (ref 8.4–10.5)
Chloride: 99 mEq/L (ref 96–112)
Creatinine, Ser: 0.91 mg/dL (ref 0.40–1.20)
GFR: 62.39 mL/min (ref >60.00)
Glucose, Bld: 112 mg/dL — ABNORMAL HIGH (ref 70–99)
Potassium: 3.7 mEq/L (ref 3.5–5.1)
Sodium: 137 mEq/L (ref 135–145)
Total Bilirubin: 1.3 mg/dL — ABNORMAL HIGH (ref 0.2–1.2)
Total Protein: 6.7 g/dL (ref 6.0–8.3)

## 2019-08-15 LAB — CBC WITH DIFFERENTIAL/PLATELET
Basophils Absolute: 0.1 10*3/uL (ref 0.0–0.1)
Basophils Relative: 0.9 % (ref 0.0–3.0)
Eosinophils Absolute: 0.1 10*3/uL (ref 0.0–0.7)
Eosinophils Relative: 2.1 % (ref 0.0–5.0)
HCT: 39 % (ref 36.0–46.0)
Hemoglobin: 12.4 g/dL (ref 12.0–15.0)
Lymphocytes Relative: 27.2 % (ref 12.0–46.0)
Lymphs Abs: 1.8 10*3/uL (ref 0.7–4.0)
MCHC: 31.7 g/dL (ref 30.0–36.0)
MCV: 64.2 fl — ABNORMAL LOW (ref 78.0–100.0)
Monocytes Absolute: 0.3 10*3/uL (ref 0.1–1.0)
Monocytes Relative: 4.6 % (ref 3.0–12.0)
Neutro Abs: 4.4 10*3/uL (ref 1.4–7.7)
Neutrophils Relative %: 65.2 % (ref 43.0–77.0)
Platelets: 298 10*3/uL (ref 150.0–400.0)
RBC: 6.07 Mil/uL — ABNORMAL HIGH (ref 3.87–5.11)
RDW: 15.4 % (ref 11.5–15.5)
WBC: 6.7 10*3/uL (ref 4.0–10.5)

## 2019-08-15 LAB — HEPATITIS C ANTIBODY
Hepatitis C Ab: NONREACTIVE
SIGNAL TO CUT-OFF: 0.01 (ref <1.00)

## 2019-08-15 LAB — HEMOGLOBIN A1C: Hgb A1c MFr Bld: 6.6 % — ABNORMAL HIGH (ref 4.6–6.5)

## 2019-08-15 LAB — TSH: TSH: 2.38 u[IU]/mL (ref 0.35–4.50)

## 2019-12-25 ENCOUNTER — Other Ambulatory Visit: Payer: Self-pay | Admitting: Family Medicine

## 2019-12-25 DIAGNOSIS — I10 Essential (primary) hypertension: Secondary | ICD-10-CM

## 2019-12-25 NOTE — Telephone Encounter (Signed)
Medicationperindopril (ACEON) 4 MG tablet   Has the patient contacted their pharmacy? No. (If no, request that the patient contact the pharmacy for the refill.) (If yes, when and what did the pharmacy advise?)  Preferred Pharmacy (with phone number or street name): [id number] [place], [place] - [address] AT [facility] OF [facility] & [address]  [address] [place] [address]  Phone:  [phone] Fax:  [phone]    Agent: Please be advised that RX refills may take up to 3 business days. We ask that you follow-up with your pharmacy.

## 2019-12-25 NOTE — Telephone Encounter (Signed)
Please advise. Pt last seen on 08/14/19 for CPE and med was last filled on 12/20/2017.

## 2019-12-25 NOTE — Telephone Encounter (Signed)
Ok to refill -- due for ov in May

## 2019-12-26 ENCOUNTER — Other Ambulatory Visit: Payer: Self-pay | Admitting: Family Medicine

## 2019-12-26 DIAGNOSIS — Z1231 Encounter for screening mammogram for malignant neoplasm of breast: Secondary | ICD-10-CM

## 2019-12-26 MED ORDER — PERINDOPRIL ERBUMINE 4 MG PO TABS: ORAL_TABLET | ORAL | 0 refills | Status: DC

## 2020-01-09 ENCOUNTER — Other Ambulatory Visit: Payer: Self-pay | Admitting: *Deleted

## 2020-01-09 DIAGNOSIS — I10 Essential (primary) hypertension: Secondary | ICD-10-CM

## 2020-01-09 MED ORDER — PERINDOPRIL ERBUMINE 4 MG PO TABS: ORAL_TABLET | ORAL | 0 refills | Status: DC

## 2020-02-14 ENCOUNTER — Ambulatory Visit: Payer: BLUE CROSS/BLUE SHIELD

## 2020-02-20 ENCOUNTER — Ambulatory Visit
Admission: RE | Admit: 2020-02-20 | Discharge: 2020-02-20 | Disposition: A | Discharge status: Home / Self Care | Payer: BLUE CROSS/BLUE SHIELD | Source: Ambulatory Visit | Attending: Family Medicine | Admitting: Family Medicine

## 2020-02-20 ENCOUNTER — Other Ambulatory Visit: Payer: Self-pay

## 2020-02-20 DIAGNOSIS — Z1231 Encounter for screening mammogram for malignant neoplasm of breast: Secondary | ICD-10-CM

## 2020-03-04 ENCOUNTER — Other Ambulatory Visit: Payer: Self-pay

## 2020-03-04 ENCOUNTER — Encounter: Payer: Self-pay | Admitting: Family Medicine

## 2020-03-04 ENCOUNTER — Ambulatory Visit (INDEPENDENT_AMBULATORY_CARE_PROVIDER_SITE_OTHER): Payer: BLUE CROSS/BLUE SHIELD | Admitting: Family Medicine

## 2020-03-04 VITALS — BP 130/70 | HR 65 | Temp 97.3°F | Resp 18 | Ht 66.0 in | Wt 245.8 lb

## 2020-03-04 DIAGNOSIS — E119 Type 2 diabetes mellitus without complications: Secondary | ICD-10-CM

## 2020-03-04 DIAGNOSIS — E785 Hyperlipidemia, unspecified: Secondary | ICD-10-CM

## 2020-03-04 DIAGNOSIS — M25561 Pain in right knee: Secondary | ICD-10-CM | POA: Insufficient documentation

## 2020-03-04 DIAGNOSIS — E1165 Type 2 diabetes mellitus with hyperglycemia: Secondary | ICD-10-CM | POA: Insufficient documentation

## 2020-03-04 DIAGNOSIS — E1169 Type 2 diabetes mellitus with other specified complication: Secondary | ICD-10-CM

## 2020-03-04 DIAGNOSIS — I10 Essential (primary) hypertension: Secondary | ICD-10-CM | POA: Diagnosis not present

## 2020-03-04 LAB — LIPID PANEL
Cholesterol: 161 mg/dL (ref 0–200)
HDL: 46.3 mg/dL (ref >39.00)
LDL Cholesterol: 97 mg/dL (ref 0–99)
NonHDL: 114.3
Total CHOL/HDL Ratio: 3
Triglycerides: 86 mg/dL (ref 0.0–149.0)
VLDL: 17.2 mg/dL (ref 0.0–40.0)

## 2020-03-04 LAB — COMPREHENSIVE METABOLIC PANEL
ALT: 11 U/L (ref 0–35)
AST: 11 U/L (ref 0–37)
Albumin: 4.5 g/dL (ref 3.5–5.2)
Alkaline Phosphatase: 49 U/L (ref 39–117)
BUN: 8 mg/dL (ref 6–23)
CO2: 31 mEq/L (ref 19–32)
Calcium: 9.3 mg/dL (ref 8.4–10.5)
Chloride: 102 mEq/L (ref 96–112)
Creatinine, Ser: 0.95 mg/dL (ref 0.40–1.20)
GFR: 59.27 mL/min — ABNORMAL LOW (ref >60.00)
Glucose, Bld: 135 mg/dL — ABNORMAL HIGH (ref 70–99)
Potassium: 4.3 mEq/L (ref 3.5–5.1)
Sodium: 138 mEq/L (ref 135–145)
Total Bilirubin: 1.4 mg/dL — ABNORMAL HIGH (ref 0.2–1.2)
Total Protein: 6.8 g/dL (ref 6.0–8.3)

## 2020-03-04 LAB — HEMOGLOBIN A1C: Hgb A1c MFr Bld: 7 % — ABNORMAL HIGH (ref 4.6–6.5)

## 2020-03-04 MED ORDER — PERINDOPRIL ERBUMINE 4 MG PO TABS: ORAL_TABLET | ORAL | 3 refills | Status: DC

## 2020-03-04 MED ORDER — TRIAMTERENE-HCTZ 37.5-25 MG PO TABS: 1.0000 | ORAL_TABLET | Freq: Every day | ORAL | 3 refills | Status: DC

## 2020-03-04 NOTE — Patient Instructions (Signed)
Carbohydrate Counting for Diabetes Mellitus, Adult  Carbohydrate counting is a method of keeping track of how many carbohydrates you eat. Eating carbohydrates naturally increases the amount of sugar (glucose) in the blood. Counting how many carbohydrates you eat helps keep your blood glucose within normal limits, which helps you manage your diabetes (diabetes mellitus). It is important to know how many carbohydrates you can safely have in each meal. This is different for every person. A diet and nutrition specialist (registered dietitian) can help you make a meal plan and calculate how many carbohydrates you should have at each meal and snack. Carbohydrates are found in the following foods:  Grains, such as breads and cereals.  Dried beans and soy products.  Starchy vegetables, such as potatoes, peas, and corn.  Fruit and fruit juices.  Milk and yogurt.  Sweets and snack foods, such as cake, cookies, candy, chips, and soft drinks. How do I count carbohydrates? There are two ways to count carbohydrates in food. You can use either of the methods or a combination of both. Reading "Nutrition Facts" on packaged food The "Nutrition Facts" list is included on the labels of almost all packaged foods and beverages in the [place] It includes:  The serving size.  Information about nutrients in each serving, including the grams (g) of carbohydrate per serving. To use the "Nutrition Facts":  Decide how many servings you will have.  Multiply the number of servings by the number of carbohydrates per serving.  The resulting number is the total amount of carbohydrates that you will be having. Learning standard serving sizes of other foods When you eat carbohydrate foods that are not packaged or do not include "Nutrition Facts" on the label, you need to measure the servings in order to count the amount of carbohydrates:  Measure the foods that you will eat with a food scale or measuring cup, if  needed.  Decide how many standard-size servings you will eat.  Multiply the number of servings by 15. Most carbohydrate-rich foods have about 15 g of carbohydrates per serving. ? For example, if you eat 8 oz (170 g) of strawberries, you will have eaten 2 servings and 30 g of carbohydrates (2 servings x 15 g = 30 g).  For foods that have more than one food mixed, such as soups and casseroles, you must count the carbohydrates in each food that is included. The following list contains standard serving sizes of common carbohydrate-rich foods. Each of these servings has about 15 g of carbohydrates:   hamburger bun or  English muffin.   oz (15 mL) syrup.   oz (14 g) jelly.  1 slice of bread.  1 six-inch tortilla.  3 oz (85 g) cooked rice or pasta.  4 oz (113 g) cooked dried beans.  4 oz (113 g) starchy vegetable, such as peas, corn, or potatoes.  4 oz (113 g) hot cereal.  4 oz (113 g) mashed potatoes or  of a large baked potato.  4 oz (113 g) canned or frozen fruit.  4 oz (120 mL) fruit juice.  4-6 crackers.  6 chicken nuggets.  6 oz (170 g) unsweetened dry cereal.  6 oz (170 g) plain fat-free yogurt or yogurt sweetened with artificial sweeteners.  8 oz (240 mL) milk.  8 oz (170 g) fresh fruit or one small piece of fruit.  24 oz (680 g) popped popcorn. Example of carbohydrate counting Sample meal  3 oz (85 g) chicken breast.  6 oz (170 g)   brown rice.  4 oz (113 g) corn.  8 oz (240 mL) milk.  8 oz (170 g) strawberries with sugar-free whipped topping. Carbohydrate calculation 1. Identify the foods that contain carbohydrates: ? Rice. ? Corn. ? Milk. ? Strawberries. 2. Calculate how many servings you have of each food: ? 2 servings rice. ? 1 serving corn. ? 1 serving milk. ? 1 serving strawberries. 3. Multiply each number of servings by 15 g: ? 2 servings rice x 15 g = 30 g. ? 1 serving corn x 15 g = 15 g. ? 1 serving milk x 15 g = 15 g. ? 1  serving strawberries x 15 g = 15 g. 4. Add together all of the amounts to find the total grams of carbohydrates eaten: ? 30 g + 15 g + 15 g + 15 g = 75 g of carbohydrates total. Summary  Carbohydrate counting is a method of keeping track of how many carbohydrates you eat.  Eating carbohydrates naturally increases the amount of sugar (glucose) in the blood.  Counting how many carbohydrates you eat helps keep your blood glucose within normal limits, which helps you manage your diabetes.  A diet and nutrition specialist (registered dietitian) can help you make a meal plan and calculate how many carbohydrates you should have at each meal and snack. This information is not intended to replace advice given to you by your health care provider. Make sure you discuss any questions you have with your health care provider. Document Revised: 03/25/2017 Document Reviewed: 02/12/2016 Elsevier Patient Education  2020 [employer]  

## 2020-03-04 NOTE — Assessment & Plan Note (Signed)
Try voltaren gel --tylenol Pt does not want to do anything more at this time

## 2020-03-04 NOTE — Progress Notes (Signed)
Patient ID: Marissa Leblanc, female    DOB: [birth year]  Age: 64 y.o. MRN: [id number]    Subjective:  Subjective  HPI TATEANNA BACH presents for f/u dm, chol and bp.  She also c/o r knee popping and causing pain-- no known injury Since memorial day.    HYPERTENSION   Blood pressure range-not checking   Chest pain- no      Dyspnea- no Lightheadedness- no   Edema- no  Other side effects - no   Medication compliance: good Low salt diet- no    DIABETES    Blood Sugar ranges-not checking   Polyuria- no New Visual problems- no  Hypoglycemic symptoms- no  Other side effects-no Medication compliance - good Last eye exam- due Foot exam- today   HYPERLIPIDEMIA  Medication compliance- good RUQ pain- no  Muscle aches- no Other side effects-no     Review of Systems  Constitutional: Negative for appetite change, diaphoresis, fatigue and unexpected weight change.  Eyes: Negative for pain, redness and visual disturbance.  Respiratory: Negative for cough, chest tightness, shortness of breath and wheezing.   Cardiovascular: Negative for chest pain, palpitations and leg swelling.  Endocrine: Negative for cold intolerance, heat intolerance, polydipsia, polyphagia and polyuria.  Genitourinary: Negative for difficulty urinating, dysuria and frequency.  Neurological: Negative for dizziness, light-headedness, numbness and headaches.    History Past Medical History:  Diagnosis Date  . Hypertension     She has a past surgical history that includes Tear duct probing.   Her family history includes Angina in her maternal grandmother; Diabetes in her sister and another family member; Heart attack in her maternal grandmother; Hypertension in her father, mother, and sister; Stroke in her father.She reports that she has never smoked. She has never used smokeless tobacco. She reports current alcohol use. She reports that she does not use drugs.  Current Outpatient Medications on File Prior to  Visit  Medication Sig Dispense Refill  . Accu-Chek FastClix Lancets MISC Check blood sugar twice daily. Dx:E11.9 306 each 1  . ACCU-CHEK GUIDE test strip Check blood sugar twice daily. Dx: E11.9 100 each 12  . blood glucose meter kit and supplies KIT Dispense based on patient and insurance preference. Use up to four times daily as directed. (FOR ICD-9 250.00, 250.01). 1 each 0  . fluticasone (FLONASE) 50 MCG/ACT nasal spray Place 2 sprays into both nostrils daily. 16 g 6   No current facility-administered medications on file prior to visit.     Objective:  Objective  Physical Exam Vitals and nursing note reviewed.  Constitutional:      Appearance: [name] well-developed.  HENT:     Head: Normocephalic and atraumatic.  Eyes:     Conjunctiva/sclera: Conjunctivae normal.  Neck:     Thyroid: No thyromegaly.     Vascular: No carotid bruit or JVD.  Cardiovascular:     Rate and Rhythm: Normal rate and regular rhythm.     Heart sounds: Normal heart sounds. No murmur heard.   Pulmonary:     Effort: Pulmonary effort is normal. No respiratory distress.     Breath sounds: Normal breath sounds. No wheezing or rales.  Chest:     Chest wall: No tenderness.  Musculoskeletal:     Cervical back: Normal range of motion and neck supple.  Neurological:     Mental Status: [name] alert and oriented to person, place, and time.    BP 130/70 (BP Location: Left Arm, Patient Position: Sitting, Cuff Size:  Large)   Pulse 65   Temp (!) 97.3 F (36.3 C) (Temporal)   Resp 18   Ht 5' 6" (1.676 m)   Wt 245 lb 12.8 oz (111.5 kg)   LMP 10/20/2010   SpO2 97%   BMI 39.67 kg/m  Wt Readings from Last 3 Encounters:  03/04/20 245 lb 12.8 oz (111.5 kg)  08/14/19 241 lb 6.4 oz (109.5 kg)  12/25/17 233 lb (105.7 kg)     Lab Results  Component Value Date   WBC 6.7 08/14/2019   HGB 12.4 08/14/2019   HCT 39.0 08/14/2019   PLT 298.0 08/14/2019   GLUCOSE 112 (H) 08/14/2019   CHOL 169 08/14/2019   TRIG  107.0 08/14/2019   HDL 46.30 08/14/2019   LDLDIRECT 116.7 04/03/2010   LDLCALC 101 (H) 08/14/2019   ALT 12 08/14/2019   AST 12 08/14/2019   NA 137 08/14/2019   K 3.7 08/14/2019   CL 99 08/14/2019   CREATININE 0.91 08/14/2019   BUN 10 08/14/2019   CO2 30 08/14/2019   TSH 2.38 08/14/2019   HGBA1C 6.6 (H) 08/14/2019   MICROALBUR 1.6 03/27/2015    MM 3D SCREEN BREAST BILATERAL  Result Date: 02/22/2020 CLINICAL DATA:  Screening. EXAM: DIGITAL SCREENING BILATERAL MAMMOGRAM WITH TOMO AND CAD COMPARISON:  Previous exam(s). ACR Breast Density Category a: The breast tissue is almost entirely fatty. FINDINGS: There are no findings suspicious for malignancy. Images were processed with CAD. IMPRESSION: No mammographic evidence of malignancy. A result letter of this screening mammogram will be mailed directly to the patient. RECOMMENDATION: Screening mammogram in one year. (Code:SM-B-01Y) BI-RADS CATEGORY  1: Negative. Electronically Signed   By: Marin Olp M.D.   On: 02/22/2020 09:33     Assessment & Plan:  Plan  I am having Aletha Halim. Oviatt maintain her fluticasone, Accu-Chek Guide, blood glucose meter kit and supplies, Accu-Chek FastClix Lancets, triamterene-hydrochlorothiazide, and perindopril.  Meds ordered this encounter  Medications  . triamterene-hydrochlorothiazide (MAXZIDE-25) 37.5-25 MG tablet    Sig: Take 1 tablet by mouth daily.    Dispense:  90 tablet    Refill:  3  . perindopril (ACEON) 4 MG tablet    Sig: TAKE 1 AND 1/2 TABLET BY MOUTH EVERY DAY    Dispense:  135 tablet    Refill:  3    Problem List Items Addressed This Visit      Unprioritized   Acute pain of right knee    Try voltaren gel --tylenol Pt does not want to do anything more at this time      Essential hypertension    Well controlled, no changes to meds. Encouraged heart healthy diet such as the DASH diet and exercise as tolerated.       Relevant Medications   triamterene-hydrochlorothiazide  (MAXZIDE-25) 37.5-25 MG tablet   perindopril (ACEON) 4 MG tablet   Other Relevant Orders   Lipid panel   Comprehensive metabolic panel   Hyperlipidemia associated with type 2 diabetes mellitus ([place])    Encouraged heart healthy diet, increase exercise, avoid trans fats, consider a krill oil cap daily      Relevant Medications   perindopril (ACEON) 4 MG tablet   Other Relevant Orders   Lipid panel   Comprehensive metabolic panel   Type 2 diabetes mellitus without complication, without long-term current use of insulin (HCC)    hgba1c to be checked , minimize simple carbs. Increase exercise as tolerated. Continue current meds      Relevant Medications  perindopril (ACEON) 4 MG tablet   Uncontrolled type 2 diabetes mellitus with hyperglycemia (HCC) - Primary   Relevant Medications   perindopril (ACEON) 4 MG tablet   Other Relevant Orders   Hemoglobin A1c   Comprehensive metabolic panel      Follow-up: Return in about 6 months (around 09/03/2020), or if symptoms worsen or fail to improve, for annual exam, fasting.  Ann Held, DO

## 2020-03-04 NOTE — Assessment & Plan Note (Signed)
Well controlled, no changes to meds. Encouraged heart healthy diet such as the DASH diet and exercise as tolerated.  °

## 2020-03-04 NOTE — Assessment & Plan Note (Signed)
Encouraged heart healthy diet, increase exercise, avoid trans fats, consider a krill oil cap daily 

## 2020-03-04 NOTE — Assessment & Plan Note (Signed)
hgba1c to be checked, minimize simple carbs. Increase exercise as tolerated. Continue current meds  

## 2020-03-06 ENCOUNTER — Other Ambulatory Visit: Payer: Self-pay

## 2020-03-06 MED ORDER — METFORMIN HCL ER 500 MG PO TB24: 500.0000 mg | ORAL_TABLET | Freq: Every day | ORAL | 2 refills | Status: DC

## 2020-05-20 ENCOUNTER — Other Ambulatory Visit: Payer: Self-pay | Admitting: Family Medicine

## 2020-05-21 ENCOUNTER — Other Ambulatory Visit: Payer: Self-pay | Admitting: Family Medicine

## 2020-08-18 ENCOUNTER — Other Ambulatory Visit: Payer: Self-pay | Admitting: Family Medicine

## 2020-09-17 ENCOUNTER — Other Ambulatory Visit: Payer: Self-pay | Admitting: Family Medicine

## 2020-09-23 ENCOUNTER — Telehealth: Payer: Self-pay

## 2020-09-23 NOTE — Telephone Encounter (Signed)
[facility] kept on sending med refill for metformin and pt was called to confirm that she needed apt before any more refills. She stated that at this time she is not taking the medication but did schedule a OV with Dr. Laury Axon to discuss this.

## 2020-10-01 ENCOUNTER — Other Ambulatory Visit: Payer: Self-pay

## 2020-10-01 MED ORDER — METFORMIN HCL ER 500 MG PO TB24: 500.0000 mg | ORAL_TABLET | Freq: Every day | ORAL | 0 refills | Status: DC

## 2020-10-07 ENCOUNTER — Ambulatory Visit: Payer: BLUE CROSS/BLUE SHIELD | Admitting: Family Medicine

## 2020-10-07 ENCOUNTER — Other Ambulatory Visit: Payer: Self-pay

## 2020-10-07 ENCOUNTER — Encounter: Payer: Self-pay | Admitting: Family Medicine

## 2020-10-07 VITALS — BP 132/72 | HR 75 | Temp 98.4°F | Resp 12 | Ht 66.0 in | Wt 244.6 lb

## 2020-10-07 DIAGNOSIS — I10 Essential (primary) hypertension: Secondary | ICD-10-CM

## 2020-10-07 DIAGNOSIS — E119 Type 2 diabetes mellitus without complications: Secondary | ICD-10-CM | POA: Diagnosis not present

## 2020-10-07 DIAGNOSIS — Z23 Encounter for immunization: Secondary | ICD-10-CM

## 2020-10-07 DIAGNOSIS — E1165 Type 2 diabetes mellitus with hyperglycemia: Secondary | ICD-10-CM

## 2020-10-07 LAB — COMPREHENSIVE METABOLIC PANEL
ALT: 11 U/L (ref 0–35)
AST: 11 U/L (ref 0–37)
Albumin: 4.4 g/dL (ref 3.5–5.2)
Alkaline Phosphatase: 53 U/L (ref 39–117)
BUN: 13 mg/dL (ref 6–23)
CO2: 31 mEq/L (ref 19–32)
Calcium: 9.4 mg/dL (ref 8.4–10.5)
Chloride: 102 mEq/L (ref 96–112)
Creatinine, Ser: 0.94 mg/dL (ref 0.40–1.20)
GFR: 64.19 mL/min (ref >60.00)
Glucose, Bld: 148 mg/dL — ABNORMAL HIGH (ref 70–99)
Potassium: 3.8 mEq/L (ref 3.5–5.1)
Sodium: 140 mEq/L (ref 135–145)
Total Bilirubin: 1.4 mg/dL — ABNORMAL HIGH (ref 0.2–1.2)
Total Protein: 6.7 g/dL (ref 6.0–8.3)

## 2020-10-07 LAB — MICROALBUMIN / CREATININE URINE RATIO
Creatinine,U: 132 mg/dL
Microalb Creat Ratio: 0.5 mg/g (ref 0.0–30.0)
Microalb, Ur: <0.7 mg/dL (ref 0.0–1.9)

## 2020-10-07 LAB — LIPID PANEL
Cholesterol: 168 mg/dL (ref 0–200)
HDL: 46.6 mg/dL (ref >39.00)
LDL Cholesterol: 102 mg/dL — ABNORMAL HIGH (ref 0–99)
NonHDL: 121.12
Total CHOL/HDL Ratio: 4
Triglycerides: 98 mg/dL (ref 0.0–149.0)
VLDL: 19.6 mg/dL (ref 0.0–40.0)

## 2020-10-07 LAB — HEMOGLOBIN A1C: Hgb A1c MFr Bld: 6.8 % — ABNORMAL HIGH (ref 4.6–6.5)

## 2020-10-07 NOTE — Progress Notes (Signed)
Patient ID: Marissa Leblanc, female    DOB: 08-18-1956  Age: 65 y.o. MRN: [id number]    Subjective:  Subjective  HPI XCARET MORAD presents for f/u dm and bp.  No complaints  HYPERTENSION   Blood pressure range-not checking   Chest pain- no      Dyspnea- no Lightheadedness- no   Edema- no  Other side effects - no   Medication compliance: good Low salt diet- yes     DIABETES    Blood Sugar ranges-not checking   Polyuria- no New Visual problems- no  Hypoglycemic symptoms- no  Other side effects-no Medication compliance - good Last eye exam- this past year  Foot exam- today      Review of Systems  Constitutional: Negative for appetite change, diaphoresis, fatigue and unexpected weight change.  Eyes: Negative for pain, redness and visual disturbance.  Respiratory: Negative for cough, chest tightness, shortness of breath and wheezing.   Cardiovascular: Negative for chest pain, palpitations and leg swelling.  Endocrine: Negative for cold intolerance, heat intolerance, polydipsia, polyphagia and polyuria.  Genitourinary: Negative for difficulty urinating, dysuria and frequency.  Neurological: Negative for dizziness, light-headedness, numbness and headaches.    History Past Medical History:  Diagnosis Date  . Hypertension     She has a past surgical history that includes Tear duct probing.   Her family history includes Angina in her maternal grandmother; Diabetes in her sister and another family member; Heart attack in her maternal grandmother; Hypertension in her father, mother, and sister; Stroke in her father.She reports that she has never smoked. She has never used smokeless tobacco. She reports current alcohol use. She reports that she does not use drugs.  Current Outpatient Medications on File Prior to Visit  Medication Sig Dispense Refill  . Accu-Chek FastClix Lancets MISC Check blood sugar twice daily. Dx:E11.9 306 each 1  . ACCU-CHEK GUIDE test strip Check blood  sugar twice daily. Dx: E11.9 100 each 12  . blood glucose meter kit and supplies KIT Dispense based on patient and insurance preference. Use up to four times daily as directed. (FOR ICD-9 250.00, 250.01). 1 each 0  . fluticasone (FLONASE) 50 MCG/ACT nasal spray Place 2 sprays into both nostrils daily. 16 g 6  . metFORMIN (GLUCOPHAGE-XR) 500 MG 24 hr tablet Take 1 tablet (500 mg total) by mouth daily with breakfast. 30 tablet 0  . perindopril (ACEON) 4 MG tablet TAKE 1 AND 1/2 TABLET BY MOUTH EVERY DAY 135 tablet 3  . triamterene-hydrochlorothiazide (MAXZIDE-25) 37.5-25 MG tablet Take 1 tablet by mouth daily. 90 tablet 3   No current facility-administered medications on file prior to visit.     Objective:  Objective  Physical Exam Vitals and nursing note reviewed.  Constitutional:      Appearance: [name] well-developed and well-nourished.  HENT:     Head: Normocephalic and atraumatic.  Eyes:     Extraocular Movements: EOM normal.     Conjunctiva/sclera: Conjunctivae normal.  Neck:     Thyroid: No thyromegaly.     Vascular: No carotid bruit or JVD.  Cardiovascular:     Rate and Rhythm: Normal rate and regular rhythm.     Heart sounds: Normal heart sounds. No murmur heard.   Pulmonary:     Effort: Pulmonary effort is normal. No respiratory distress.     Breath sounds: Normal breath sounds. No wheezing or rales.  Chest:     Chest wall: No tenderness.  Musculoskeletal:  General: No edema.     Cervical back: Normal range of motion and neck supple.  Neurological:     Mental Status: [name] alert and oriented to person, place, and time.  Psychiatric:        Mood and Affect: Mood and affect normal.    Diabetic Foot Exam - Simple   Simple Foot Form Diabetic Foot exam was performed with the following findings: Yes 10/07/2020  8:24 AM  Visual Inspection No deformities, no ulcerations, no other skin breakdown bilaterally: Yes Sensation Testing Intact to touch and monofilament  testing bilaterally: Yes Pulse Check Posterior Tibialis and Dorsalis pulse intact bilaterally: Yes Comments     BP 132/72 (BP Location: Right Arm, Cuff Size: Large)   Pulse 75   Temp 98.4 F (36.9 C) (Oral)   Resp 12   Ht 5' 6" (1.676 m)   Wt 244 lb 9.6 oz (110.9 kg)   LMP 10/20/2010   SpO2 99%   BMI 39.48 kg/m  Wt Readings from Last 3 Encounters:  10/07/20 244 lb 9.6 oz (110.9 kg)  03/04/20 245 lb 12.8 oz (111.5 kg)  08/14/19 241 lb 6.4 oz (109.5 kg)     Lab Results  Component Value Date   WBC 6.7 08/14/2019   HGB 12.4 08/14/2019   HCT 39.0 08/14/2019   PLT 298.0 08/14/2019   GLUCOSE 135 (H) 03/04/2020   CHOL 161 03/04/2020   TRIG 86.0 03/04/2020   HDL 46.30 03/04/2020   LDLDIRECT 116.7 04/03/2010   LDLCALC 97 03/04/2020   ALT 11 03/04/2020   AST 11 03/04/2020   NA 138 03/04/2020   K 4.3 03/04/2020   CL 102 03/04/2020   CREATININE 0.95 03/04/2020   BUN 8 03/04/2020   CO2 31 03/04/2020   TSH 2.38 08/14/2019   HGBA1C 7.0 (H) 03/04/2020   MICROALBUR 1.6 03/27/2015    MM 3D SCREEN BREAST BILATERAL  Result Date: 02/22/2020 CLINICAL DATA:  Screening. EXAM: DIGITAL SCREENING BILATERAL MAMMOGRAM WITH TOMO AND CAD COMPARISON:  Previous exam(s). ACR Breast Density Category a: The breast tissue is almost entirely fatty. FINDINGS: There are no findings suspicious for malignancy. Images were processed with CAD. IMPRESSION: No mammographic evidence of malignancy. A result letter of this screening mammogram will be mailed directly to the patient. RECOMMENDATION: Screening mammogram in one year. (Code:SM-B-01Y) BI-RADS CATEGORY  1: Negative. Electronically Signed   By: Marin Olp M.D.   On: 02/22/2020 09:33     Assessment & Plan:  Plan  I am having Aletha Halim. Bultema maintain her fluticasone, Accu-Chek Guide, blood glucose meter kit and supplies, Accu-Chek FastClix Lancets, triamterene-hydrochlorothiazide, perindopril, and metFORMIN.  No orders of the defined types were  placed in this encounter.   Problem List Items Addressed This Visit      Unprioritized   Essential hypertension    Well controlled, no changes to meds. Encouraged heart healthy diet such as the DASH diet and exercise as tolerated.       Type 2 diabetes mellitus without complication, without long-term current use of insulin (HCC)    hgba1c to be checked , minimize simple carbs. Increase exercise as tolerated. Continue current meds       Uncontrolled type 2 diabetes mellitus with hyperglycemia (HCC) - Primary   Relevant Orders   Lipid panel   Hemoglobin A1c   Comprehensive metabolic panel   Microalbumin / creatinine urine ratio    Other Visit Diagnoses    Primary hypertension       Relevant  Orders   Lipid panel   Hemoglobin A1c   Comprehensive metabolic panel   Microalbumin / creatinine urine ratio   Need for influenza vaccination       Relevant Orders   Flu Vaccine QUAD 36+ mos IM (Completed)      Follow-up: Return in about 6 months (around 04/06/2021), or if symptoms worsen or fail to improve, for annual exam, fasting.  Ann Held, DO

## 2020-10-07 NOTE — Assessment & Plan Note (Signed)
hgba1c to be checked, minimize simple carbs. Increase exercise as tolerated. Continue current meds  

## 2020-10-07 NOTE — Patient Instructions (Signed)

## 2020-10-07 NOTE — Assessment & Plan Note (Signed)
Well controlled, no changes to meds. Encouraged heart healthy diet such as the DASH diet and exercise as tolerated.  °

## 2020-11-16 ENCOUNTER — Other Ambulatory Visit: Payer: Self-pay | Admitting: Family Medicine

## 2021-02-21 ENCOUNTER — Other Ambulatory Visit: Payer: Self-pay

## 2021-02-21 DIAGNOSIS — I10 Essential (primary) hypertension: Secondary | ICD-10-CM

## 2021-02-21 MED ORDER — TRIAMTERENE-HCTZ 37.5-25 MG PO TABS: 1.0000 | ORAL_TABLET | Freq: Every day | ORAL | 3 refills | Status: DC

## 2021-04-24 ENCOUNTER — Other Ambulatory Visit: Payer: Self-pay | Admitting: Family Medicine

## 2021-04-24 DIAGNOSIS — Z1231 Encounter for screening mammogram for malignant neoplasm of breast: Secondary | ICD-10-CM

## 2021-04-28 ENCOUNTER — Other Ambulatory Visit: Payer: Self-pay

## 2021-04-28 DIAGNOSIS — I10 Essential (primary) hypertension: Secondary | ICD-10-CM

## 2021-04-28 MED ORDER — PERINDOPRIL ERBUMINE 4 MG PO TABS: ORAL_TABLET | ORAL | 2 refills | Status: DC

## 2021-05-01 ENCOUNTER — Other Ambulatory Visit: Payer: Self-pay

## 2021-05-01 ENCOUNTER — Ambulatory Visit
Admission: RE | Admit: 2021-05-01 | Discharge: 2021-05-01 | Disposition: A | Discharge status: Home / Self Care | Payer: BLUE CROSS/BLUE SHIELD | Source: Ambulatory Visit | Attending: Family Medicine | Admitting: Family Medicine

## 2021-05-01 DIAGNOSIS — Z1231 Encounter for screening mammogram for malignant neoplasm of breast: Secondary | ICD-10-CM

## 2021-05-05 ENCOUNTER — Other Ambulatory Visit: Payer: Self-pay

## 2021-05-06 ENCOUNTER — Other Ambulatory Visit (HOSPITAL_COMMUNITY)
Admission: RE | Admit: 2021-05-06 | Discharge: 2021-05-06 | Disposition: A | Discharge status: Home / Self Care | Payer: BLUE CROSS/BLUE SHIELD | Source: Ambulatory Visit | Attending: Family Medicine | Admitting: Family Medicine

## 2021-05-06 ENCOUNTER — Encounter: Payer: Self-pay | Admitting: Family Medicine

## 2021-05-06 ENCOUNTER — Ambulatory Visit (INDEPENDENT_AMBULATORY_CARE_PROVIDER_SITE_OTHER): Payer: BLUE CROSS/BLUE SHIELD | Admitting: Family Medicine

## 2021-05-06 VITALS — BP 128/66 | HR 62 | Temp 98.1°F | Resp 18 | Ht 65.0 in | Wt 246.4 lb

## 2021-05-06 DIAGNOSIS — I10 Essential (primary) hypertension: Secondary | ICD-10-CM | POA: Diagnosis not present

## 2021-05-06 DIAGNOSIS — Z1211 Encounter for screening for malignant neoplasm of colon: Secondary | ICD-10-CM

## 2021-05-06 DIAGNOSIS — Z0000 Encounter for general adult medical examination without abnormal findings: Secondary | ICD-10-CM | POA: Insufficient documentation

## 2021-05-06 DIAGNOSIS — E1169 Type 2 diabetes mellitus with other specified complication: Secondary | ICD-10-CM

## 2021-05-06 DIAGNOSIS — Z23 Encounter for immunization: Secondary | ICD-10-CM

## 2021-05-06 DIAGNOSIS — E1165 Type 2 diabetes mellitus with hyperglycemia: Secondary | ICD-10-CM | POA: Diagnosis not present

## 2021-05-06 DIAGNOSIS — E785 Hyperlipidemia, unspecified: Secondary | ICD-10-CM

## 2021-05-06 LAB — CBC WITH DIFFERENTIAL/PLATELET
Basophils Absolute: 0 10*3/uL (ref 0.0–0.1)
Basophils Relative: 0.7 % (ref 0.0–3.0)
Eosinophils Absolute: 0.1 10*3/uL (ref 0.0–0.7)
Eosinophils Relative: 2.7 % (ref 0.0–5.0)
HCT: 37.4 % (ref 36.0–46.0)
Hemoglobin: 11.7 g/dL — ABNORMAL LOW (ref 12.0–15.0)
Lymphocytes Relative: 30.9 % (ref 12.0–46.0)
Lymphs Abs: 1.6 10*3/uL (ref 0.7–4.0)
MCHC: 31.4 g/dL (ref 30.0–36.0)
MCV: 64.3 fl — ABNORMAL LOW (ref 78.0–100.0)
Monocytes Absolute: 0.2 10*3/uL (ref 0.1–1.0)
Monocytes Relative: 4.5 % (ref 3.0–12.0)
Neutro Abs: 3.2 10*3/uL (ref 1.4–7.7)
Neutrophils Relative %: 61.2 % (ref 43.0–77.0)
Platelets: 274 10*3/uL (ref 150.0–400.0)
RBC: 5.81 Mil/uL — ABNORMAL HIGH (ref 3.87–5.11)
RDW: 15.1 % (ref 11.5–15.5)
WBC: 5.3 10*3/uL (ref 4.0–10.5)

## 2021-05-06 LAB — COMPREHENSIVE METABOLIC PANEL
ALT: 13 U/L (ref 0–35)
AST: 12 U/L (ref 0–37)
Albumin: 4.2 g/dL (ref 3.5–5.2)
Alkaline Phosphatase: 51 U/L (ref 39–117)
BUN: 12 mg/dL (ref 6–23)
CO2: 31 mEq/L (ref 19–32)
Calcium: 9.6 mg/dL (ref 8.4–10.5)
Chloride: 101 mEq/L (ref 96–112)
Creatinine, Ser: 1.01 mg/dL (ref 0.40–1.20)
GFR: 58.65 mL/min — ABNORMAL LOW (ref >60.00)
Glucose, Bld: 129 mg/dL — ABNORMAL HIGH (ref 70–99)
Potassium: 4.2 mEq/L (ref 3.5–5.1)
Sodium: 139 mEq/L (ref 135–145)
Total Bilirubin: 1.3 mg/dL — ABNORMAL HIGH (ref 0.2–1.2)
Total Protein: 6.7 g/dL (ref 6.0–8.3)

## 2021-05-06 LAB — LIPID PANEL
Cholesterol: 149 mg/dL (ref 0–200)
HDL: 44.1 mg/dL (ref >39.00)
LDL Cholesterol: 87 mg/dL (ref 0–99)
NonHDL: 104.62
Total CHOL/HDL Ratio: 3
Triglycerides: 88 mg/dL (ref 0.0–149.0)
VLDL: 17.6 mg/dL (ref 0.0–40.0)

## 2021-05-06 LAB — MICROALBUMIN / CREATININE URINE RATIO
Creatinine,U: 29.4 mg/dL
Microalb Creat Ratio: 2.4 mg/g (ref 0.0–30.0)
Microalb, Ur: <0.7 mg/dL (ref 0.0–1.9)

## 2021-05-06 LAB — HEMOGLOBIN A1C: Hgb A1c MFr Bld: 7.2 % — ABNORMAL HIGH (ref 4.6–6.5)

## 2021-05-06 LAB — TSH: TSH: 2.65 u[IU]/mL (ref 0.35–5.50)

## 2021-05-06 MED ORDER — TRIAMTERENE-HCTZ 37.5-25 MG PO TABS: 1.0000 | ORAL_TABLET | Freq: Every day | ORAL | 3 refills | Status: DC

## 2021-05-06 NOTE — Assessment & Plan Note (Signed)
Well controlled, no changes to meds. Encouraged heart healthy diet such as the DASH diet and exercise as tolerated.  °

## 2021-05-06 NOTE — Patient Instructions (Signed)
Preventive Care 65-65 Years Old, Female Preventive care refers to lifestyle choices and visits with your health care provider that can promote health and wellness. This includes: A yearly physical exam. This is also called an annual wellness visit. Regular dental and eye exams. Immunizations. Screening for certain conditions. Healthy lifestyle choices, such as: Eating a healthy diet. Getting regular exercise. Not using drugs or products that contain nicotine and tobacco. Limiting alcohol use. What can I expect for my preventive care visit? Physical exam Your health care provider will check your: Height and weight. These may be used to calculate your BMI (body mass index). BMI is a measurement that tells if you are at a healthy weight. Heart rate and blood pressure. Body temperature. Skin for abnormal spots. Counseling Your health care provider may ask you questions about your: Past medical problems. Family's medical history. Alcohol, tobacco, and drug use. Emotional well-being. Home life and relationship well-being. Sexual activity. Diet, exercise, and sleep habits. Work and work Statistician. Access to firearms. Method of birth control. Menstrual cycle. Pregnancy history. What immunizations do I need?  Vaccines are usually given at various ages, according to a schedule. Your health care provider will recommend vaccines for you based on your age, medicalhistory, and lifestyle or other factors, such as travel or where you work. What tests do I need? Blood tests Lipid and cholesterol levels. These may be checked every 5 years, or more often if you are over 65 years old. Hepatitis C test. Hepatitis B test. Screening Lung cancer screening. You may have this screening every year starting at age 65 if you have a 30-pack-year history of smoking and currently smoke or have quit within the past 15 years. Colorectal cancer screening. All adults should have this screening starting at  age 65 and continuing until age 3. Your health care provider may recommend screening at age 65 if you are at increased risk. You will have tests every 1-10 years, depending on your results and the type of screening test. Diabetes screening. This is done by checking your blood sugar (glucose) after you have not eaten for a while (fasting). You may have this done every 1-3 years. Mammogram. This may be done every 1-2 years. Talk with your health care provider about when you should start having regular mammograms. This may depend on whether you have a family history of breast cancer. BRCA-related cancer screening. This may be done if you have a family history of breast, ovarian, tubal, or peritoneal cancers. Pelvic exam and Pap test. This may be done every 3 years starting at age 65. Starting at age 65, this may be done every 5 years if you have a Pap test in combination with an HPV test. Other tests STD (sexually transmitted disease) testing, if you are at risk. Bone density scan. This is done to screen for osteoporosis. You may have this scan if you are at high risk for osteoporosis. Talk with your health care provider about your test results, treatment options,and if necessary, the need for more tests. Follow these instructions at home: Eating and drinking  Eat a diet that includes fresh fruits and vegetables, whole grains, lean protein, and low-fat dairy products. Take vitamin and mineral supplements as recommended by your health care provider. Do not drink alcohol if: Your health care provider tells you not to drink. You are pregnant, may be pregnant, or are planning to become pregnant. If you drink alcohol: Limit how much you have to 0-1 drink a day. Be aware  of how much alcohol is in your drink. In the [place] one drink equals one 12 oz bottle of beer (355 mL), one 5 oz glass of wine (148 mL), or one 1 oz glass of hard liquor (44 mL).  Lifestyle Take daily care of your teeth and  gums. Brush your teeth every morning and night with fluoride toothpaste. Floss one time each day. Stay active. Exercise for at least 30 minutes 5 or more days each week. Do not use any products that contain nicotine or tobacco, such as cigarettes, e-cigarettes, and chewing tobacco. If you need help quitting, ask your health care provider. Do not use drugs. If you are sexually active, practice safe sex. Use a condom or other form of protection to prevent STIs (sexually transmitted infections). If you do not wish to become pregnant, use a form of birth control. If you plan to become pregnant, see your health care provider for a prepregnancy visit. If told by your health care provider, take low-dose aspirin daily starting at age 29. Find healthy ways to cope with stress, such as: Meditation, yoga, or listening to music. Journaling. Talking to a trusted person. Spending time with friends and family. Safety Always wear your seat belt while driving or riding in a vehicle. Do not drive: If you have been drinking alcohol. Do not ride with someone who has been drinking. When you are tired or distracted. While texting. Wear a helmet and other protective equipment during sports activities. If you have firearms in your house, make sure you follow all gun safety procedures. What's next? Visit your health care provider once a year for an annual wellness visit. Ask your health care provider how often you should have your eyes and teeth checked. Stay up to date on all vaccines. This information is not intended to replace advice given to you by your health care provider. Make sure you discuss any questions you have with your healthcare provider. Document Revised: 06/04/2020 Document Reviewed: 05/12/2018 Elsevier Patient Education  2022 [employer].

## 2021-05-06 NOTE — Assessment & Plan Note (Signed)
hgba1c to be checked, minimize simple carbs. Increase exercise as tolerated. Continue current meds  

## 2021-05-06 NOTE — Assessment & Plan Note (Signed)
Encourage heart healthy diet such as MIND or DASH diet, increase exercise, avoid trans fats, simple carbohydrates and processed foods, consider a krill or fish or flaxseed oil cap daily.  °

## 2021-05-06 NOTE — Progress Notes (Signed)
Subjective:   By signing my name below, I, Shehryar Baig, attest that this documentation has been prepared under the direction and in the presence of Dr. Roma Schanz, DO. 05/06/2021    Patient ID: Marissa Leblanc, female    DOB: 1956/02/07, 65 y.o.   MRN: [id number]  Chief Complaint  Patient presents with   Annual Exam    Pt states fasting     HPI Patient is in today for a comprehensive physical exam.  She denies having any fever, ear pain, congestion, sinus pain, sore throat, eye pain, chest pain, palpations, cough, SOB, wheezing, n/v/d, constipation, blood in stool, dysuria, frequency, hematuria, or headaches at this time. Her blood pressure is doing well during this visit. She continues taking 37.5-25 mg Maxzide-25 daily PO and reports no new issues while taking it. [name] also requesting a refill on it as well.  BP Readings from Last 3 Encounters:  05/06/21 128/66  10/07/20 132/72  03/04/20 130/70   She does not regularly check her blood sugar levels at home and is planning on checking it more consistently. [name] UTD on vision care at this time.   Lab Results  Component Value Date   HGBA1C 6.8 (H) 10/07/2020   [name] planning on regularly participating in exercise by using her new treadmill.  She has 3 pfizer Covid-19 vaccines and is willing to get the 2nd booster vaccine at a later date. [name] eligible for the shingrix vaccine and is willing to get it.   Past Medical History:  Diagnosis Date   Hypertension     Past Surgical History:  Procedure Laterality Date   TEAR DUCT PROBING      Family History  Problem Relation Age of Onset   Hypertension Sister    Diabetes Sister    Hypertension Mother    Hypertension Father    Stroke Father    Diabetes Other    Angina Maternal Grandmother    Heart attack Maternal Grandmother     Social History   Socioeconomic History   Marital status: Divorced    Spouse name: Not on file   Number of children: Not on  file   Years of education: Not on file   Highest education level: Not on file  Occupational History   Occupation: [employer]    Employer: [employer]  Tobacco Use   Smoking status: Never   Smokeless tobacco: Never  Substance and Sexual Activity   Alcohol use: Yes    Alcohol/week: 0.0 standard drinks    Comment: rare   Drug use: No   Sexual activity: Not Currently    Partners: Male  Other Topics Concern   Not on file  Social History Narrative   Exercise-- no---but she will start   Social Determinants of Health   Financial Resource Strain: Not on file  Food Insecurity: Not on file  Transportation Needs: Not on file  Physical Activity: Not on file  Stress: Not on file  Social Connections: Not on file  Intimate Partner Violence: Not on file    Outpatient Medications Prior to Visit  Medication Sig Dispense Refill   Accu-Chek FastClix Lancets MISC Check blood sugar twice daily. Dx:E11.9 306 each 1   ACCU-CHEK GUIDE test strip Check blood sugar twice daily. Dx: E11.9 100 each 12   blood glucose meter kit and supplies KIT Dispense based on patient and insurance preference. Use up to four times daily as directed. (FOR ICD-9 250.00, 250.01). 1 each 0  metFORMIN (GLUCOPHAGE-XR) 500 MG 24 hr tablet Take 1 tablet (500 mg total) by mouth daily with breakfast. 90 tablet 1   perindopril (ACEON) 4 MG tablet TAKE 1 AND 1/2 TABLET BY MOUTH EVERY DAY 45 tablet 2   triamterene-hydrochlorothiazide (MAXZIDE-25) 37.5-25 MG tablet Take 1 tablet by mouth daily. 90 tablet 3   fluticasone (FLONASE) 50 MCG/ACT nasal spray Place 2 sprays into both nostrils daily. 16 g 6   No facility-administered medications prior to visit.    Allergies  Allergen Reactions   Penicillins Hives    Review of Systems  Constitutional:  Negative for chills, fever and malaise/fatigue.  HENT:  Negative for congestion, ear pain, hearing loss, sinus pain and sore throat.   Eyes:  Negative for blurred vision, pain and  discharge.  Respiratory:  Negative for cough, sputum production, shortness of breath and wheezing.   Cardiovascular:  Negative for chest pain, palpitations and leg swelling.  Gastrointestinal:  Negative for abdominal pain, blood in stool, constipation, diarrhea, heartburn, nausea and vomiting.  Genitourinary:  Negative for dysuria, frequency, hematuria and urgency.  Musculoskeletal:  Negative for back pain, falls and myalgias.  Skin:  Negative for rash.  Neurological:  Negative for dizziness, sensory change, loss of consciousness, weakness and headaches.  Endo/Heme/Allergies:  Negative for environmental allergies. Does not bruise/bleed easily.  Psychiatric/Behavioral:  Negative for depression and suicidal ideas. The patient is not nervous/anxious and does not have insomnia.       Objective:    Physical Exam Vitals and nursing note reviewed. Exam conducted with a chaperone present.  Constitutional:      General: [name] not in acute distress.    Appearance: Normal appearance. [name] well-developed. [name] not ill-appearing.  HENT:     Head: Normocephalic and atraumatic.     Right Ear: Tympanic membrane, ear canal and external ear normal.     Left Ear: Tympanic membrane, ear canal and external ear normal.     Nose: Nose normal.  Eyes:     Extraocular Movements: Extraocular movements intact.     Pupils: Pupils are equal, round, and reactive to light.  Cardiovascular:     Rate and Rhythm: Normal rate and regular rhythm.     Heart sounds: Normal heart sounds. No murmur heard.   No gallop.  Pulmonary:     Effort: Pulmonary effort is normal. No respiratory distress.     Breath sounds: Normal breath sounds. No wheezing or rales.  Chest:     Chest wall: No tenderness.  Abdominal:     General: Bowel sounds are normal. There is no distension.     Palpations: Abdomen is soft. There is no mass.     Tenderness: There is no abdominal tenderness. There is no guarding or rebound.   Genitourinary:    General: Normal vulva.     Exam position: Lithotomy position.     Labia:        Right: No rash.        Left: No rash.      Vagina: Normal.     Cervix: Normal. No eversion.     Uterus: Normal. Not enlarged and not tender.      Adnexa: Right adnexa normal and left adnexa normal.       Right: No mass, tenderness or fullness.         Left: No mass, tenderness or fullness.       Rectum: Normal. Guaiac result negative. No tenderness or external hemorrhoid.  Musculoskeletal:  Cervical back: Normal range of motion and neck supple.  Skin:    General: Skin is warm and dry.  Neurological:     Mental Status: [name] alert and oriented to person, place, and time.  Psychiatric:        Behavior: Behavior normal.        Thought Content: Thought content normal.        Judgment: Judgment normal.    BP 128/66 (BP Location: Right Arm, Patient Position: Sitting, Cuff Size: Large)   Pulse 62   Temp 98.1 F (36.7 C) (Oral)   Resp 18   Ht _0  (1.651 m)   Wt 246 lb 6.4 oz (111.8 kg)   LMP 10/20/2010   SpO2 96%   BMI 41.00 kg/m  Wt Readings from Last 3 Encounters:  05/06/21 246 lb 6.4 oz (111.8 kg)  10/07/20 244 lb 9.6 oz (110.9 kg)  03/04/20 245 lb 12.8 oz (111.5 kg)    Diabetic Foot Exam - Simple   No data filed    Lab Results  Component Value Date   WBC 6.7 08/14/2019   HGB 12.4 08/14/2019   HCT 39.0 08/14/2019   PLT 298.0 08/14/2019   GLUCOSE 148 (H) 10/07/2020   CHOL 168 10/07/2020   TRIG 98.0 10/07/2020   HDL 46.60 10/07/2020   LDLDIRECT 116.7 04/03/2010   LDLCALC 102 (H) 10/07/2020   ALT 11 10/07/2020   AST 11 10/07/2020   NA 140 10/07/2020   K 3.8 10/07/2020   CL 102 10/07/2020   CREATININE 0.94 10/07/2020   BUN 13 10/07/2020   CO2 31 10/07/2020   TSH 2.38 08/14/2019   HGBA1C 6.8 (H) 10/07/2020   MICROALBUR <0.7 10/07/2020    Lab Results  Component Value Date   TSH 2.38 08/14/2019   Lab Results  Component Value Date   WBC 6.7  08/14/2019   HGB 12.4 08/14/2019   HCT 39.0 08/14/2019   MCV 64.2 Repeated and verified X2. (L) 08/14/2019   PLT 298.0 08/14/2019   Lab Results  Component Value Date   NA 140 10/07/2020   K 3.8 10/07/2020   CO2 31 10/07/2020   GLUCOSE 148 (H) 10/07/2020   BUN 13 10/07/2020   CREATININE 0.94 10/07/2020   BILITOT 1.4 (H) 10/07/2020   ALKPHOS 53 10/07/2020   AST 11 10/07/2020   ALT 11 10/07/2020   PROT 6.7 10/07/2020   ALBUMIN 4.4 10/07/2020   CALCIUM 9.4 10/07/2020   GFR 64.19 10/07/2020   Lab Results  Component Value Date   CHOL 168 10/07/2020   Lab Results  Component Value Date   HDL 46.60 10/07/2020   Lab Results  Component Value Date   LDLCALC 102 (H) 10/07/2020   Lab Results  Component Value Date   TRIG 98.0 10/07/2020   Lab Results  Component Value Date   CHOLHDL 4 10/07/2020   Lab Results  Component Value Date   HGBA1C 6.8 (H) 10/07/2020   Mammogram- Last completed 05/01/2021. Results are normal. Repeat in 1 year.  Pap smear- Last completed 11/25/2017. Results normal. Repeat in 3 years.  Colonoscopy- Last completed 09/23/2006. Results are normal. Repeat in 10 years. Due. [name] interested in making an appointment and does not have a preference on the gender of the provider.      Assessment & Plan:   Problem List Items Addressed This Visit       Unprioritized   Essential hypertension    Well controlled, no changes to meds. Encouraged  heart healthy diet such as the DASH diet and exercise as tolerated.       Relevant Medications   triamterene-hydrochlorothiazide (MAXZIDE-25) 37.5-25 MG tablet   Other Relevant Orders   TSH   Lipid panel   CBC with Differential/Platelet   Microalbumin / creatinine urine ratio   Hyperlipidemia associated with type 2 diabetes mellitus ([place])    Encourage heart healthy diet such as MIND or DASH diet, increase exercise, avoid trans fats, simple carbohydrates and processed foods, consider a krill or fish or flaxseed oil  cap daily.       Uncontrolled type 2 diabetes mellitus with hyperglycemia ([place])    hgba1c to be checked , minimize simple carbs. Increase exercise as tolerated. Continue current meds       Relevant Orders   Hemoglobin A1c   Comprehensive metabolic panel   Microalbumin / creatinine urine ratio   Other Visit Diagnoses     Preventative health care    -  Primary   Relevant Orders   Cytology - PAP( [place])   TSH   Lipid panel   Hemoglobin A1c   CBC with Differential/Platelet   Comprehensive metabolic panel   Ambulatory referral to Gastroenterology   Colon cancer screening       Relevant Orders   Ambulatory referral to Gastroenterology   Need for shingles vaccine       Relevant Orders   Varicella-zoster vaccine IM (Completed)        Meds ordered this encounter  Medications   triamterene-hydrochlorothiazide (MAXZIDE-25) 37.5-25 MG tablet    Sig: Take 1 tablet by mouth daily.    Dispense:  90 tablet    Refill:  3    I, Dr. Roma Schanz, DO, personally preformed the services described in this documentation.  All medical record entries made by the scribe were at my direction and in my presence.  I have reviewed the chart and discharge instructions (if applicable) and agree that the record reflects my personal performance and is accurate and complete. 05/06/2021   I,Shehryar Baig,acting as a scribe for Ann Held, DO.,have documented all relevant documentation on the behalf of Ann Held, DO,as directed by  Ann Held, DO while in the presence of Ann Held, DO.   Ann Held, DO

## 2021-05-07 LAB — CYTOLOGY - PAP: Diagnosis: NEGATIVE

## 2021-05-12 ENCOUNTER — Other Ambulatory Visit: Payer: Self-pay | Admitting: Family Medicine

## 2021-05-12 DIAGNOSIS — E1165 Type 2 diabetes mellitus with hyperglycemia: Secondary | ICD-10-CM

## 2021-05-12 DIAGNOSIS — I10 Essential (primary) hypertension: Secondary | ICD-10-CM

## 2021-05-13 ENCOUNTER — Other Ambulatory Visit: Payer: Self-pay

## 2021-05-13 MED ORDER — METFORMIN HCL 500 MG PO TABS
500.0000 mg | ORAL_TABLET | Freq: Two times a day (BID) | ORAL | 1 refills | Status: DC
Start: 2021-05-13 — End: 2021-11-27

## 2021-05-27 DIAGNOSIS — E119 Type 2 diabetes mellitus without complications: Secondary | ICD-10-CM | POA: Diagnosis not present

## 2021-05-27 DIAGNOSIS — H2513 Age-related nuclear cataract, bilateral: Secondary | ICD-10-CM | POA: Diagnosis not present

## 2021-05-27 LAB — HM DIABETES EYE EXAM

## 2021-06-04 ENCOUNTER — Encounter: Payer: Self-pay | Admitting: *Deleted

## 2021-07-27 ENCOUNTER — Other Ambulatory Visit: Payer: Self-pay | Admitting: Family Medicine

## 2021-07-27 DIAGNOSIS — I10 Essential (primary) hypertension: Secondary | ICD-10-CM

## 2021-10-25 ENCOUNTER — Other Ambulatory Visit: Payer: Self-pay | Admitting: Family Medicine

## 2021-10-25 DIAGNOSIS — I10 Essential (primary) hypertension: Secondary | ICD-10-CM

## 2021-10-27 ENCOUNTER — Other Ambulatory Visit: Payer: Self-pay | Admitting: Family Medicine

## 2021-10-27 DIAGNOSIS — I10 Essential (primary) hypertension: Secondary | ICD-10-CM

## 2021-11-09 IMAGING — MG MM DIGITAL SCREENING BILAT W/ TOMO AND CAD
6 of 12 series · 6 of 36 positions shown · non-contrast
Comparison: Previous exam(s).

CLINICAL DATA: Screening.

EXAM:
DIGITAL SCREENING BILATERAL MAMMOGRAM WITH TOMOSYNTHESIS AND CAD
TECHNIQUE: Bilateral screening digital craniocaudal and mediolateral oblique
mammograms were obtained. Bilateral screening digital breast
tomosynthesis was performed. The images were evaluated with
computer-aided detection.

[L MLO synth-2D]
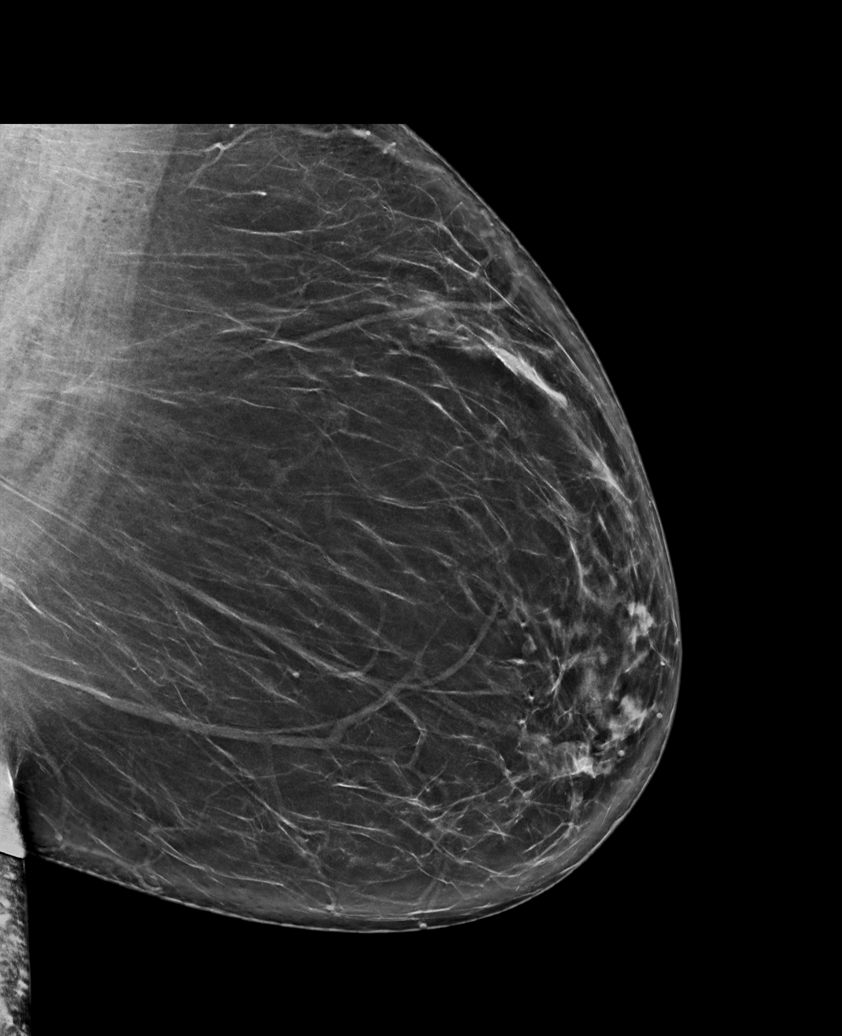

[R CC synth-2D (1 of 2)]
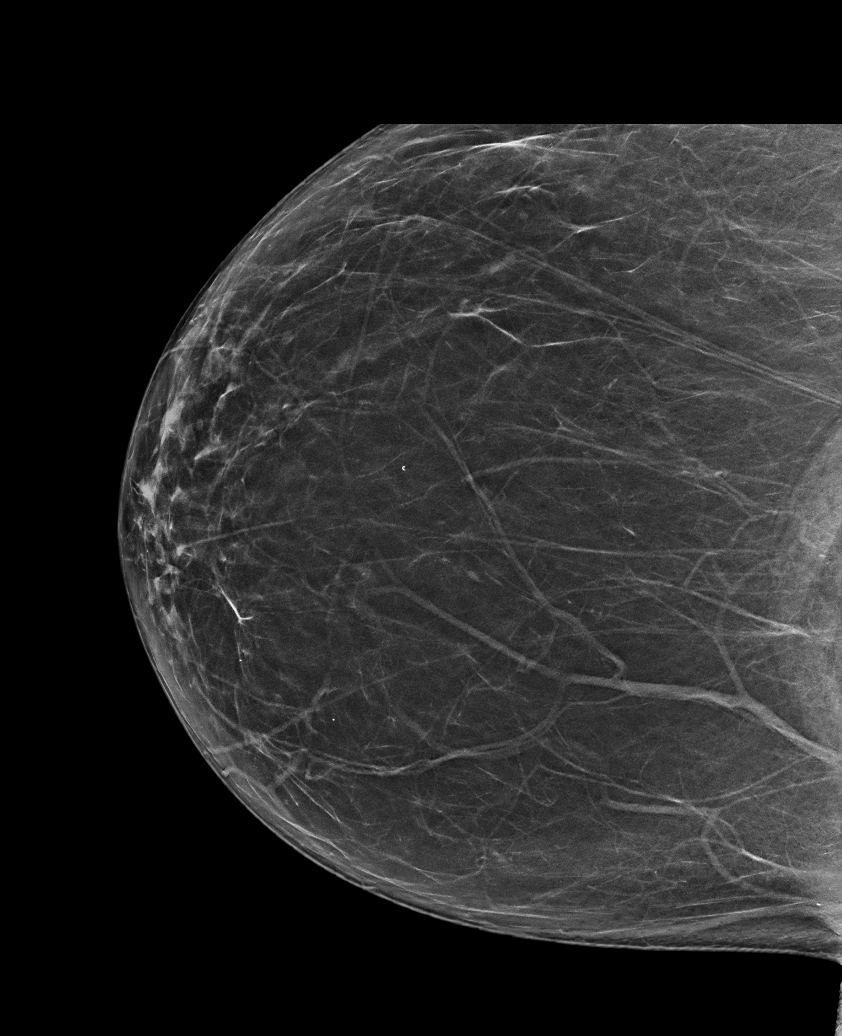

[R CC synth-2D (2 of 2)]
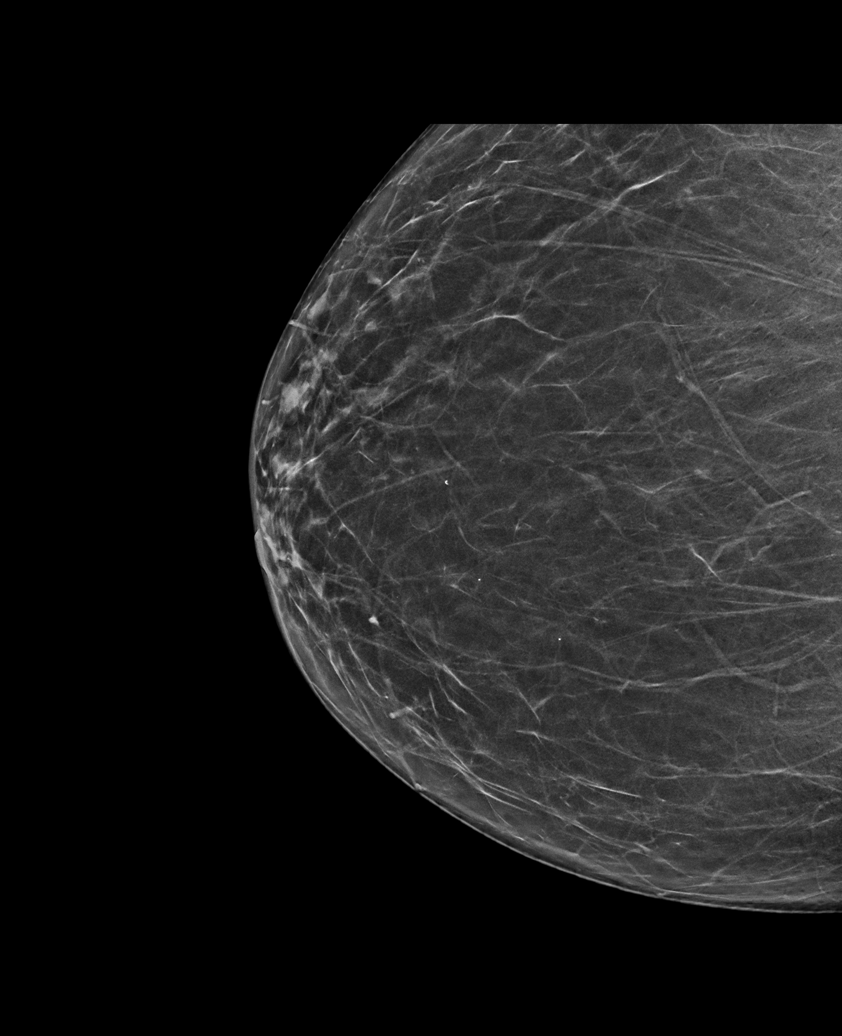

[R MLO synth-2D]
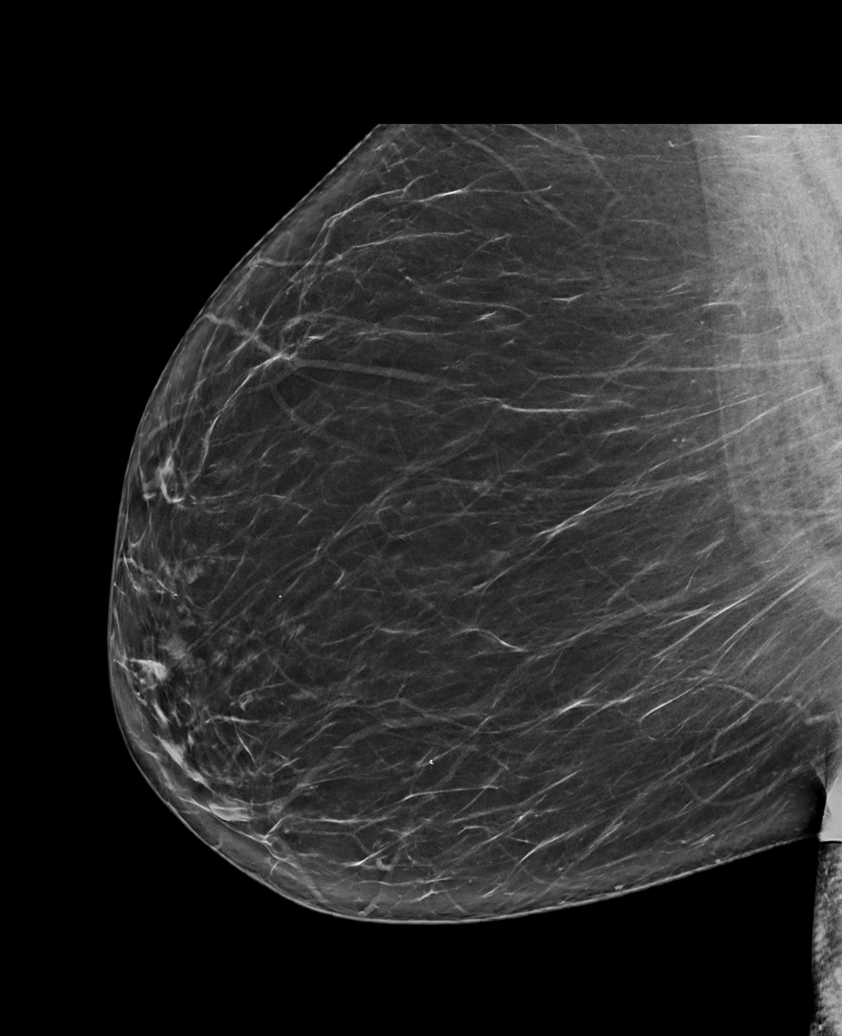

[L CC synth-2D (1 of 2)]
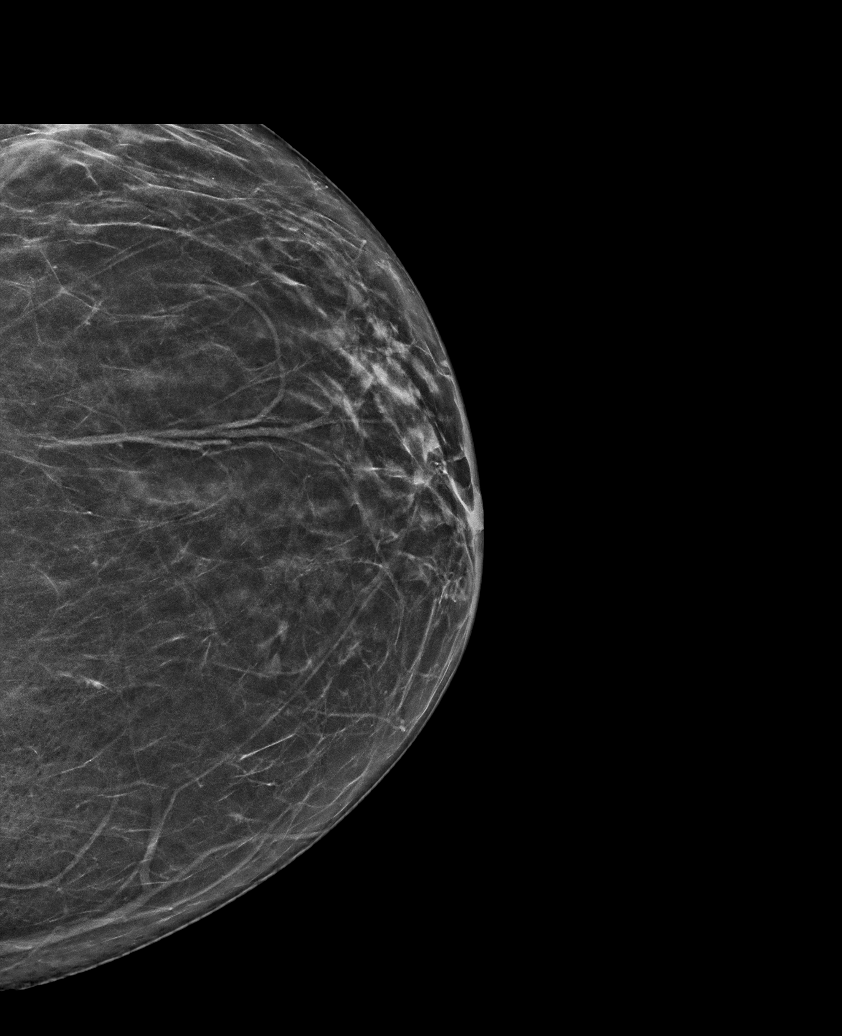

[L CC synth-2D (2 of 2)]
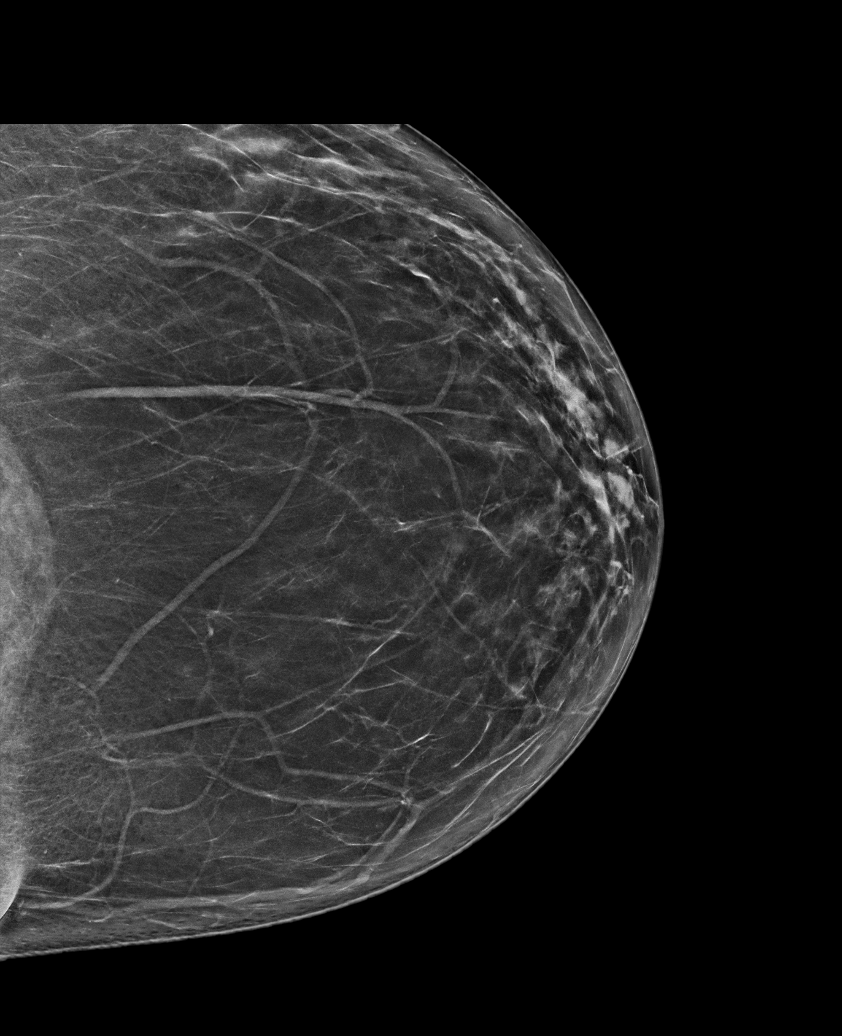

[6 of 36 positions shown; findings below may reference images not displayed]

ACR Breast Density Category b: There are scattered areas of
fibroglandular density.
FINDINGS: There are no findings suspicious for malignancy.
IMPRESSION: No mammographic evidence of malignancy. A result letter of this
screening mammogram will be mailed directly to the patient.

RECOMMENDATION:
Screening mammogram in one year. (Code:51-O-LD2)

BI-RADS CATEGORY  1: Negative.

## 2021-11-24 ENCOUNTER — Other Ambulatory Visit: Payer: Self-pay | Admitting: Family Medicine

## 2021-11-24 DIAGNOSIS — I10 Essential (primary) hypertension: Secondary | ICD-10-CM

## 2021-11-27 ENCOUNTER — Encounter: Payer: Self-pay | Admitting: Family Medicine

## 2021-11-27 ENCOUNTER — Ambulatory Visit: Payer: BLUE CROSS/BLUE SHIELD | Admitting: Family Medicine

## 2021-11-27 VITALS — BP 130/70 | HR 75 | Temp 98.3°F | Resp 18 | Ht 65.0 in | Wt 235.8 lb

## 2021-11-27 DIAGNOSIS — E1165 Type 2 diabetes mellitus with hyperglycemia: Secondary | ICD-10-CM

## 2021-11-27 DIAGNOSIS — E785 Hyperlipidemia, unspecified: Secondary | ICD-10-CM

## 2021-11-27 DIAGNOSIS — E1169 Type 2 diabetes mellitus with other specified complication: Secondary | ICD-10-CM | POA: Diagnosis not present

## 2021-11-27 DIAGNOSIS — E2839 Other primary ovarian failure: Secondary | ICD-10-CM

## 2021-11-27 DIAGNOSIS — I10 Essential (primary) hypertension: Secondary | ICD-10-CM | POA: Diagnosis not present

## 2021-11-27 DIAGNOSIS — Z23 Encounter for immunization: Secondary | ICD-10-CM | POA: Diagnosis not present

## 2021-11-27 DIAGNOSIS — Z1211 Encounter for screening for malignant neoplasm of colon: Secondary | ICD-10-CM | POA: Diagnosis not present

## 2021-11-27 MED ORDER — PERINDOPRIL ERBUMINE 4 MG PO TABS: ORAL_TABLET | ORAL | 0 refills | Status: DC

## 2021-11-27 MED ORDER — METFORMIN HCL 500 MG PO TABS: 500.0000 mg | ORAL_TABLET | Freq: Every day | ORAL | 1 refills | Status: DC

## 2021-11-27 NOTE — Assessment & Plan Note (Signed)
Encourage heart healthy diet such as MIND or DASH diet, increase exercise, avoid trans fats, simple carbohydrates and processed foods, consider a krill or fish or flaxseed oil cap daily.  °

## 2021-11-27 NOTE — Patient Instructions (Signed)

## 2021-11-27 NOTE — Progress Notes (Signed)
? ?Subjective:  ? ?By signing my name below, I, Shehryar Baig, attest that this documentation has been prepared under the direction and in the presence of Ann Held, DO. 11/27/2021 ? ? ? Patient ID: Marissa Leblanc, female    DOB: April 09, 1956, 66 y.o.   MRN: [id number] ? ?Chief Complaint  ?Patient presents with  ? Hypertension  ? Hyperlipidemia  ? Diabetes  ? Follow-up  ? ? ?Hypertension ?Pertinent negatives include no blurred vision, chest pain, headaches, malaise/fatigue, palpitations or shortness of breath.  ?Hyperlipidemia ?Pertinent negatives include no chest pain or shortness of breath.  ?Diabetes ?Pertinent negatives for hypoglycemia include no dizziness, headaches or nervousness/anxiousness. Pertinent negatives for diabetes include no blurred vision and no chest pain.  ?Patient is in today for a office visit.  ? ?[name] requesting a refill for 6 mg Aceon daily PO.  ?She does not check her blood sugars at home. She has a working machine at home and is planning on measuring herself more often. She reports last year when she was prescribed 500 mg metformin she was not taking it at all. During her next visit when her blood sugar was elevated she was prescribed 1000 mg metformin but still did not start taking it. She recently started taking 500 mg metformin daily PO consistently.  ?Lab Results  ?Component Value Date  ? HGBA1C 7.2 (H) 05/06/2021  ? ?She reports receiving one shingles vaccine and was informed she can receive one at her pharmacy. [name] eligible for the pneumonia vaccine and is interested in receiving it during this visit. She did not receive the flu vaccine this year. She reports receiving 4 Covid-19 vaccines.  ?Her mammogram is due next year. She has never received a bone density scan and is interested in receiving it on the same day as her next mammogram.  ? ? ?Past Medical History:  ?Diagnosis Date  ? Hypertension   ? ? ?Past Surgical History:  ?Procedure Laterality Date  ? TEAR DUCT  PROBING    ? ? ?Family History  ?Problem Relation Age of Onset  ? Hypertension Sister   ? Diabetes Sister   ? Hypertension Mother   ? Hypertension Father   ? Stroke Father   ? Diabetes Other   ? Angina Maternal Grandmother   ? Heart attack Maternal Grandmother   ? ? ?Social History  ? ?Socioeconomic History  ? Marital status: Divorced  ?  Spouse name: Not on file  ? Number of children: Not on file  ? Years of education: Not on file  ? Highest education level: Not on file  ?Occupational History  ? Occupation: [employer]  ?  Employer: [employer]  ?Tobacco Use  ? Smoking status: Never  ? Smokeless tobacco: Never  ?Substance and Sexual Activity  ? Alcohol use: Yes  ?  Alcohol/week: 0.0 standard drinks  ?  Comment: rare  ? Drug use: No  ? Sexual activity: Not Currently  ?  Partners: Male  ?Other Topics Concern  ? Not on file  ?Social History Narrative  ? Exercise-- no---but she will start  ? ?Social Determinants of Health  ? ?Financial Resource Strain: Not on file  ?Food Insecurity: Not on file  ?Transportation Needs: Not on file  ?Physical Activity: Not on file  ?Stress: Not on file  ?Social Connections: Not on file  ?Intimate Partner Violence: Not on file  ? ? ?Outpatient Medications Prior to Visit  ?Medication Sig Dispense Refill  ? Accu-Chek FastClix Lancets  MISC Check blood sugar twice daily. Dx:E11.9 306 each 1  ? ACCU-CHEK GUIDE test strip Check blood sugar twice daily. Dx: E11.9 100 each 12  ? blood glucose meter kit and supplies KIT Dispense based on patient and insurance preference. Use up to four times daily as directed. (FOR ICD-9 250.00, 250.01). 1 each 0  ? triamterene-hydrochlorothiazide (MAXZIDE-25) 37.5-25 MG tablet Take 1 tablet by mouth daily. 90 tablet 3  ? metFORMIN (GLUCOPHAGE) 500 MG tablet Take 1 tablet (500 mg total) by mouth 2 (two) times daily with a meal. 180 tablet 1  ? perindopril (ACEON) 4 MG tablet TAKE 1 AND 1/2 TABLETS BY MOUTH EVERY DAY. Pt needs office visit for more refills 45  tablet 0  ? ?No facility-administered medications prior to visit.  ? ? ?Allergies  ?Allergen Reactions  ? Penicillins Hives  ? ? ?Review of Systems  ?Constitutional:  Negative for fever and malaise/fatigue.  ?HENT:  Negative for congestion.   ?Eyes:  Negative for blurred vision.  ?Respiratory:  Negative for shortness of breath.   ?Cardiovascular:  Negative for chest pain, palpitations and leg swelling.  ?Gastrointestinal:  Negative for abdominal pain, blood in stool and nausea.  ?Genitourinary:  Negative for dysuria and frequency.  ?Musculoskeletal:  Negative for falls.  ?Skin:  Negative for rash.  ?Neurological:  Negative for dizziness, loss of consciousness and headaches.  ?Endo/Heme/Allergies:  Negative for environmental allergies.  ?Psychiatric/Behavioral:  Negative for depression. The patient is not nervous/anxious.   ? ?   ?Objective:  ?  ?Physical Exam ?Vitals and nursing note reviewed.  ?Constitutional:   ?   General: [name] not in acute distress. ?   Appearance: Normal appearance. [name] not ill-appearing.  ?HENT:  ?   Head: Normocephalic and atraumatic.  ?   Right Ear: External ear normal.  ?   Left Ear: External ear normal.  ?Eyes:  ?   Extraocular Movements: Extraocular movements intact.  ?   Pupils: Pupils are equal, round, and reactive to light.  ?Cardiovascular:  ?   Rate and Rhythm: Normal rate and regular rhythm.  ?   Pulses:     ?     Dorsalis pedis pulses are 2+ on the right side and 2+ on the left side.  ?     Posterior tibial pulses are 2+ on the right side and 2+ on the left side.  ?   Heart sounds: Normal heart sounds. No murmur heard. ?  No gallop.  ?Pulmonary:  ?   Effort: Pulmonary effort is normal. No respiratory distress.  ?   Breath sounds: Normal breath sounds. No wheezing or rales.  ?Musculoskeletal:  ?   Right lower leg: 1+ Pitting Edema present.  ?   Left lower leg: 1+ Pitting Edema present.  ?Feet:  ?   Comments: Diabetic Foot Exam - Simple   ?Simple Foot Form ?Diabetic Foot exam  was performed with the following findings: Yes  ?11/27/2021  4:24 PM  ?Visual Inspection ?No deformities, no ulcerations, no other skin breakdown bilaterally: Yes ?Sensation Testing ?Intact to touch and monofilament testing bilaterally: Yes ?Pulse Check ?Posterior Tibialis and Dorsalis pulse intact bilaterally: Yes ?Comments ?  ? ?Skin: ?   General: Skin is warm and dry.  ?Neurological:  ?   Mental Status: [name] alert and oriented to person, place, and time.  ?Psychiatric:     ?   Mood and Affect: Mood normal.     ?   Behavior: Behavior normal.     ?  Thought Content: Thought content normal.     ?   Judgment: Judgment normal.  ? ? ?BP 130/70 (BP Location: Right Arm, Patient Position: Sitting, Cuff Size: Large)   Pulse 75   Temp 98.3 ?F (36.8 ?C) (Oral)   Resp 18   Ht '5\' 5"'  (1.651 m)   Wt 235 lb 12.8 oz (107 kg)   LMP 10/20/2010   SpO2 97%   BMI 39.24 kg/m?  ?Wt Readings from Last 3 Encounters:  ?11/27/21 235 lb 12.8 oz (107 kg)  ?05/06/21 246 lb 6.4 oz (111.8 kg)  ?10/07/20 244 lb 9.6 oz (110.9 kg)  ? ? ?Diabetic Foot Exam - Simple   ?Simple Foot Form ?Diabetic Foot exam was performed with the following findings: Yes 11/27/2021  4:24 PM  ?Visual Inspection ?No deformities, no ulcerations, no other skin breakdown bilaterally: Yes ?Sensation Testing ?Intact to touch and monofilament testing bilaterally: Yes ?Pulse Check ?Posterior Tibialis and Dorsalis pulse intact bilaterally: Yes ?Comments ?  ? ?Lab Results  ?Component Value Date  ? WBC 5.3 05/06/2021  ? HGB 11.7 (L) 05/06/2021  ? HCT 37.4 05/06/2021  ? PLT 274.0 05/06/2021  ? GLUCOSE 129 (H) 05/06/2021  ? CHOL 149 05/06/2021  ? TRIG 88.0 05/06/2021  ? HDL 44.10 05/06/2021  ? LDLDIRECT 116.7 04/03/2010  ? [place] 87 05/06/2021  ? ALT 13 05/06/2021  ? AST 12 05/06/2021  ? NA 139 05/06/2021  ? K 4.2 05/06/2021  ? CL 101 05/06/2021  ? CREATININE 1.01 05/06/2021  ? BUN 12 05/06/2021  ? CO2 31 05/06/2021  ? TSH 2.65 05/06/2021  ? HGBA1C 7.2 (H) 05/06/2021  ?  MICROALBUR <0.7 05/06/2021  ? ? ?Lab Results  ?Component Value Date  ? TSH 2.65 05/06/2021  ? ?Lab Results  ?Component Value Date  ? WBC 5.3 05/06/2021  ? HGB 11.7 (L) 05/06/2021  ? HCT 37.4 05/06/2021  ? MCV 64.3 Repeat

## 2021-11-27 NOTE — Assessment & Plan Note (Signed)
Well controlled, no changes to meds. Encouraged heart healthy diet such as the DASH diet and exercise as tolerated.  °

## 2021-11-27 NOTE — Assessment & Plan Note (Signed)
hgba1c to be checked minimize simple carbs. Increase exercise as tolerated. Continue current meds 

## 2021-11-28 ENCOUNTER — Other Ambulatory Visit (INDEPENDENT_AMBULATORY_CARE_PROVIDER_SITE_OTHER): Payer: BLUE CROSS/BLUE SHIELD

## 2021-11-28 ENCOUNTER — Other Ambulatory Visit: Payer: Self-pay | Admitting: Family Medicine

## 2021-11-28 DIAGNOSIS — E1169 Type 2 diabetes mellitus with other specified complication: Secondary | ICD-10-CM | POA: Diagnosis not present

## 2021-11-28 DIAGNOSIS — E785 Hyperlipidemia, unspecified: Secondary | ICD-10-CM | POA: Diagnosis not present

## 2021-11-28 DIAGNOSIS — I10 Essential (primary) hypertension: Secondary | ICD-10-CM | POA: Diagnosis not present

## 2021-11-28 DIAGNOSIS — Z1231 Encounter for screening mammogram for malignant neoplasm of breast: Secondary | ICD-10-CM

## 2021-11-28 DIAGNOSIS — E1165 Type 2 diabetes mellitus with hyperglycemia: Secondary | ICD-10-CM | POA: Diagnosis not present

## 2021-11-28 LAB — MICROALBUMIN / CREATININE URINE RATIO
Creatinine,U: 190.5 mg/dL
Microalb Creat Ratio: 0.4 mg/g (ref 0.0–30.0)
Microalb, Ur: 0.8 mg/dL (ref 0.0–1.9)

## 2021-11-28 LAB — CBC WITH DIFFERENTIAL/PLATELET
Basophils Absolute: 0 10*3/uL (ref 0.0–0.1)
Basophils Relative: 0.6 % (ref 0.0–3.0)
Eosinophils Absolute: 0.2 10*3/uL (ref 0.0–0.7)
Eosinophils Relative: 3.1 % (ref 0.0–5.0)
HCT: 37.9 % (ref 36.0–46.0)
Hemoglobin: 12 g/dL (ref 12.0–15.0)
Lymphocytes Relative: 34 % (ref 12.0–46.0)
Lymphs Abs: 1.9 10*3/uL (ref 0.7–4.0)
MCHC: 31.8 g/dL (ref 30.0–36.0)
MCV: 63.9 fl — ABNORMAL LOW (ref 78.0–100.0)
Monocytes Absolute: 0.3 10*3/uL (ref 0.1–1.0)
Monocytes Relative: 5.1 % (ref 3.0–12.0)
Neutro Abs: 3.1 10*3/uL (ref 1.4–7.7)
Neutrophils Relative %: 57.2 % (ref 43.0–77.0)
Platelets: 278 10*3/uL (ref 150.0–400.0)
RBC: 5.92 Mil/uL — ABNORMAL HIGH (ref 3.87–5.11)
RDW: 15.3 % (ref 11.5–15.5)
WBC: 5.5 10*3/uL (ref 4.0–10.5)

## 2021-11-28 LAB — HEMOGLOBIN A1C: Hgb A1c MFr Bld: 7 % — ABNORMAL HIGH (ref 4.6–6.5)

## 2021-11-28 LAB — LIPID PANEL
Cholesterol: 166 mg/dL (ref 0–200)
HDL: 50.3 mg/dL (ref >39.00)
LDL Cholesterol: 100 mg/dL — ABNORMAL HIGH (ref 0–99)
NonHDL: 115.23
Total CHOL/HDL Ratio: 3
Triglycerides: 74 mg/dL (ref 0.0–149.0)
VLDL: 14.8 mg/dL (ref 0.0–40.0)

## 2021-11-28 LAB — COMPREHENSIVE METABOLIC PANEL
ALT: 10 U/L (ref 0–35)
AST: 10 U/L (ref 0–37)
Albumin: 4.3 g/dL (ref 3.5–5.2)
Alkaline Phosphatase: 52 U/L (ref 39–117)
BUN: 14 mg/dL (ref 6–23)
CO2: 33 mEq/L — ABNORMAL HIGH (ref 19–32)
Calcium: 9.4 mg/dL (ref 8.4–10.5)
Chloride: 100 mEq/L (ref 96–112)
Creatinine, Ser: 1.12 mg/dL (ref 0.40–1.20)
GFR: 51.61 mL/min — ABNORMAL LOW (ref >60.00)
Glucose, Bld: 149 mg/dL — ABNORMAL HIGH (ref 70–99)
Potassium: 4.4 mEq/L (ref 3.5–5.1)
Sodium: 138 mEq/L (ref 135–145)
Total Bilirubin: 1.3 mg/dL — ABNORMAL HIGH (ref 0.2–1.2)
Total Protein: 6.6 g/dL (ref 6.0–8.3)

## 2021-11-28 NOTE — Addendum Note (Signed)
Addended by: Kelle Darting A on: 11/28/2021 08:46 AM ? ? Modules accepted: Orders ? ?

## 2021-12-05 ENCOUNTER — Other Ambulatory Visit: Payer: Self-pay | Admitting: Family Medicine

## 2021-12-05 DIAGNOSIS — E1165 Type 2 diabetes mellitus with hyperglycemia: Secondary | ICD-10-CM

## 2021-12-24 ENCOUNTER — Other Ambulatory Visit: Payer: Self-pay | Admitting: Family Medicine

## 2021-12-24 DIAGNOSIS — I10 Essential (primary) hypertension: Secondary | ICD-10-CM

## 2021-12-31 ENCOUNTER — Other Ambulatory Visit: Payer: Self-pay

## 2021-12-31 ENCOUNTER — Telehealth: Payer: Self-pay | Admitting: Family Medicine

## 2021-12-31 DIAGNOSIS — I10 Essential (primary) hypertension: Secondary | ICD-10-CM

## 2021-12-31 MED ORDER — PERINDOPRIL ERBUMINE 4 MG PO TABS: ORAL_TABLET | ORAL | 0 refills | Status: DC

## 2021-12-31 NOTE — Telephone Encounter (Signed)
Rx was sent  

## 2021-12-31 NOTE — Telephone Encounter (Signed)
Pt states her rx needs to be a 90 day supply for insurance to cover it. ? ?Medication: perindopril (ACEON) 4 MG tablet ? ?Has the patient contacted their pharmacy? Yes.   ? ?Preferred Pharmacy:  ?[id number] [place], [place] - [phone] AT [facility] [address]  ? [address] [place], [place] [address]  ?Phone:  [phone]  Fax:  [phone] ?

## 2022-01-02 MED ORDER — PERINDOPRIL ERBUMINE 4 MG PO TABS: ORAL_TABLET | ORAL | 1 refills | Status: DC

## 2022-01-02 NOTE — Addendum Note (Signed)
Addended by: Roxanne Gates on: 01/02/2022 01:24 PM ? ? Modules accepted: Orders ? ?

## 2022-01-02 NOTE — Telephone Encounter (Signed)
Patient states she needs a prescription for 90 days not 90 pills. Since she takes 1 and 1/2 a pill everyday she would need about 135 pills for a 90 day supply. She would like this fixed since she has been without it for a few days now. Please advise.  ?

## 2022-01-02 NOTE — Telephone Encounter (Signed)
Corrected Rx sent

## 2022-01-23 ENCOUNTER — Other Ambulatory Visit: Payer: Self-pay | Admitting: Family Medicine

## 2022-02-12 ENCOUNTER — Encounter: Payer: Self-pay | Admitting: *Deleted

## 2022-03-09 ENCOUNTER — Ambulatory Visit: Payer: BLUE CROSS/BLUE SHIELD | Admitting: Family Medicine

## 2022-03-12 ENCOUNTER — Ambulatory Visit: Payer: BLUE CROSS/BLUE SHIELD | Admitting: Family Medicine

## 2022-03-30 ENCOUNTER — Encounter: Payer: Self-pay | Admitting: Family Medicine

## 2022-03-30 ENCOUNTER — Ambulatory Visit: Payer: BLUE CROSS/BLUE SHIELD | Admitting: Family Medicine

## 2022-03-30 VITALS — BP 120/80 | HR 70 | Temp 98.5°F | Resp 18 | Ht 65.0 in | Wt 239.0 lb

## 2022-03-30 DIAGNOSIS — Z23 Encounter for immunization: Secondary | ICD-10-CM

## 2022-03-30 DIAGNOSIS — E1169 Type 2 diabetes mellitus with other specified complication: Secondary | ICD-10-CM | POA: Diagnosis not present

## 2022-03-30 DIAGNOSIS — I10 Essential (primary) hypertension: Secondary | ICD-10-CM | POA: Diagnosis not present

## 2022-03-30 DIAGNOSIS — E785 Hyperlipidemia, unspecified: Secondary | ICD-10-CM | POA: Diagnosis not present

## 2022-03-30 DIAGNOSIS — E119 Type 2 diabetes mellitus without complications: Secondary | ICD-10-CM

## 2022-03-30 DIAGNOSIS — E1165 Type 2 diabetes mellitus with hyperglycemia: Secondary | ICD-10-CM

## 2022-03-30 MED ORDER — TRIAMTERENE-HCTZ 37.5-25 MG PO TABS: 1.0000 | ORAL_TABLET | Freq: Every day | ORAL | 3 refills | Status: DC

## 2022-03-30 MED ORDER — PERINDOPRIL ERBUMINE 4 MG PO TABS: ORAL_TABLET | ORAL | 3 refills | Status: DC

## 2022-03-30 MED ORDER — PERINDOPRIL ERBUMINE 4 MG PO TABS: ORAL_TABLET | ORAL | 1 refills | Status: DC

## 2022-03-30 NOTE — Patient Instructions (Signed)

## 2022-03-30 NOTE — Progress Notes (Addendum)
Subjective:   By signing my name below, I, Carylon Perches, attest that this documentation has been prepared under the direction and in the presence of Ann Held DO 03/30/2022   Patient ID: Marissa Leblanc, female    DOB: 09/14/1956, 66 y.o.   MRN: [id number]  No chief complaint on file.   HPI Patient is in today for an office visit  [name] requesting a refill of 4 Mg of Perindopril and 37.5 - 25 Mg of Maxzide-25. She states that she occasionally has troubles getting a refill of her Perindopril at the pharmacy.   [name] currently working and is not on Medicare.   She reports that she has received the second dose of the Shingles vaccine.   She states that she has received the Cologuard and is planning to use it.   She has received four Covid-19 vaccines.   [name] not regally checking her blood sugars. [name] considering to try [facility].  Lab Results  Component Value Date   HGBA1C 7.0 (H) 11/28/2021   As of today's visit, her blood pressure levels are normal. [name] currently taking 1 and 1/2 tablets of 4 Mg of Perindopril and 1 tablet of 37.5 - 25 Mg of Maxzide-25.  BP Readings from Last 3 Encounters:  03/30/22 120/80  11/27/21 130/70  05/06/21 128/66   Pulse Readings from Last 3 Encounters:  03/30/22 70  11/27/21 75  05/06/21 62    Past Medical History:  Diagnosis Date   Hypertension     Past Surgical History:  Procedure Laterality Date   TEAR DUCT PROBING      Family History  Problem Relation Age of Onset   Hypertension Sister    Diabetes Sister    Hypertension Mother    Hypertension Father    Stroke Father    Diabetes Other    Angina Maternal Grandmother    Heart attack Maternal Grandmother     Social History   Socioeconomic History   Marital status: Divorced    Spouse name: Not on file   Number of children: Not on file   Years of education: Not on file   Highest education level: Not on file  Occupational History    Occupation: [employer]    Employer: [employer]  Tobacco Use   Smoking status: Never   Smokeless tobacco: Never  Substance and Sexual Activity   Alcohol use: Yes    Alcohol/week: 0.0 standard drinks of alcohol    Comment: rare   Drug use: No   Sexual activity: Not Currently    Partners: Male  Other Topics Concern   Not on file  Social History Narrative   Exercise-- no---but she will start   Social Determinants of Health   Financial Resource Strain: Not on file  Food Insecurity: Not on file  Transportation Needs: Not on file  Physical Activity: Not on file  Stress: Not on file  Social Connections: Not on file  Intimate Partner Violence: Not on file    Outpatient Medications Prior to Visit  Medication Sig Dispense Refill   Accu-Chek FastClix Lancets MISC Check blood sugar twice daily. Dx:E11.9 306 each 1   ACCU-CHEK GUIDE test strip Check blood sugar twice daily. Dx: E11.9 100 each 12   blood glucose meter kit and supplies KIT Dispense based on patient and insurance preference. Use up to four times daily as directed. (FOR ICD-9 250.00, 250.01). 1 each 0   metFORMIN (GLUCOPHAGE)  500 MG tablet Take 1 tablet (500 mg total) by mouth daily with breakfast. 90 tablet 1   perindopril (ACEON) 4 MG tablet TAKE 1 AND 1/2 TABLETS BY MOUTH EVERY DAY 135 tablet 1   triamterene-hydrochlorothiazide (MAXZIDE-25) 37.5-25 MG tablet Take 1 tablet by mouth daily. 90 tablet 3   No facility-administered medications prior to visit.    Allergies  Allergen Reactions   Penicillins Hives    Review of Systems  Constitutional:  Negative for fever and malaise/fatigue.  HENT:  Negative for congestion.   Eyes:  Negative for blurred vision.  Respiratory:  Negative for shortness of breath.   Cardiovascular:  Negative for chest pain, palpitations and leg swelling.  Gastrointestinal:  Negative for abdominal pain, blood in stool and nausea.  Genitourinary:  Negative for dysuria and frequency.   Musculoskeletal:  Negative for falls.  Skin:  Negative for rash.  Neurological:  Negative for dizziness, loss of consciousness and headaches.  Endo/Heme/Allergies:  Negative for environmental allergies.  Psychiatric/Behavioral:  Negative for depression. The patient is not nervous/anxious.        Objective:    Physical Exam Vitals and nursing note reviewed.  Constitutional:      General: [name] not in acute distress.    Appearance: Normal appearance. [name] not ill-appearing.  HENT:     Head: Normocephalic and atraumatic.     Right Ear: External ear normal.     Left Ear: External ear normal.  Eyes:     Extraocular Movements: Extraocular movements intact.     Pupils: Pupils are equal, round, and reactive to light.  Cardiovascular:     Rate and Rhythm: Normal rate and regular rhythm.     Heart sounds: Normal heart sounds. No murmur heard.    No gallop.  Pulmonary:     Effort: Pulmonary effort is normal. No respiratory distress.     Breath sounds: Normal breath sounds. No wheezing or rales.  Skin:    General: Skin is warm and dry.  Neurological:     Mental Status: [name] alert and oriented to person, place, and time.  Psychiatric:        Judgment: Judgment normal.    Diabetic Foot Exam - Simple   Simple Foot Form  03/30/2022  1:13 PM  Visual Inspection No deformities, no ulcerations, no other skin breakdown bilaterally: Yes Sensation Testing Intact to touch and monofilament testing bilaterally: Yes Pulse Check Posterior Tibialis and Dorsalis pulse intact bilaterally: Yes Comments      LMP 10/20/2010  Wt Readings from Last 3 Encounters:  11/27/21 235 lb 12.8 oz (107 kg)  05/06/21 246 lb 6.4 oz (111.8 kg)  10/07/20 244 lb 9.6 oz (110.9 kg)    Diabetic Foot Exam - Simple   No data filed    Lab Results  Component Value Date   WBC 5.5 11/28/2021   HGB 12.0 11/28/2021   HCT 37.9 11/28/2021   PLT 278.0 11/28/2021   GLUCOSE 149 (H) 11/28/2021   CHOL 166  11/28/2021   TRIG 74.0 11/28/2021   HDL 50.30 11/28/2021   LDLDIRECT 116.7 04/03/2010   LDLCALC 100 (H) 11/28/2021   ALT 10 11/28/2021   AST 10 11/28/2021   NA 138 11/28/2021   K 4.4 11/28/2021   CL 100 11/28/2021   CREATININE 1.12 11/28/2021   BUN 14 11/28/2021   CO2 33 (H) 11/28/2021   TSH 2.65 05/06/2021   HGBA1C 7.0 (H) 11/28/2021   MICROALBUR 0.8 11/28/2021    Lab  Results  Component Value Date   TSH 2.65 05/06/2021   Lab Results  Component Value Date   WBC 5.5 11/28/2021   HGB 12.0 11/28/2021   HCT 37.9 11/28/2021   MCV 63.9 Repeated and verified X2. (L) 11/28/2021   PLT 278.0 11/28/2021   Lab Results  Component Value Date   NA 138 11/28/2021   K 4.4 11/28/2021   CO2 33 (H) 11/28/2021   GLUCOSE 149 (H) 11/28/2021   BUN 14 11/28/2021   CREATININE 1.12 11/28/2021   BILITOT 1.3 (H) 11/28/2021   ALKPHOS 52 11/28/2021   AST 10 11/28/2021   ALT 10 11/28/2021   PROT 6.6 11/28/2021   ALBUMIN 4.3 11/28/2021   CALCIUM 9.4 11/28/2021   GFR 51.61 (L) 11/28/2021   Lab Results  Component Value Date   CHOL 166 11/28/2021   Lab Results  Component Value Date   HDL 50.30 11/28/2021   Lab Results  Component Value Date   LDLCALC 100 (H) 11/28/2021   Lab Results  Component Value Date   TRIG 74.0 11/28/2021   Lab Results  Component Value Date   CHOLHDL 3 11/28/2021   Lab Results  Component Value Date   HGBA1C 7.0 (H) 11/28/2021       Assessment & Plan:   Problem List Items Addressed This Visit   None   No orders of the defined types were placed in this encounter.   I, Carylon Perches, personally preformed the services described in this documentation.  All medical record entries made by the scribe were at my direction and in my presence.  I have reviewed the chart and discharge instructions (if applicable) and agree that the record reflects my personal performance and is accurate and complete. 03/30/2022   I,Amber Collins,acting as a scribe for [employer], DO.,have documented all relevant documentation on the behalf of Ann Held, DO,as directed by  Ann Held, DO while in the presence of Ann Held, DO.   [employer]

## 2022-03-30 NOTE — Assessment & Plan Note (Signed)
Well controlled, no changes to meds. Encouraged heart healthy diet such as the DASH diet and exercise as tolerated.  °

## 2022-03-30 NOTE — Assessment & Plan Note (Signed)
hgba1c to be checked, minimize simple carbs. Increase exercise as tolerated. Continue current meds  

## 2022-03-30 NOTE — Assessment & Plan Note (Signed)
Encourage heart healthy diet such as MIND or DASH diet, increase exercise, avoid trans fats, simple carbohydrates and processed foods, consider a krill or fish or flaxseed oil cap daily.  °

## 2022-03-31 LAB — LIPID PANEL
Cholesterol: 161 mg/dL (ref 0–200)
HDL: 46.6 mg/dL (ref >39.00)
LDL Cholesterol: 95 mg/dL (ref 0–99)
NonHDL: 114.76
Total CHOL/HDL Ratio: 3
Triglycerides: 97 mg/dL (ref 0.0–149.0)
VLDL: 19.4 mg/dL (ref 0.0–40.0)

## 2022-03-31 LAB — CBC WITH DIFFERENTIAL/PLATELET
Basophils Absolute: 0.1 10*3/uL (ref 0.0–0.1)
Basophils Relative: 1.1 % (ref 0.0–3.0)
Eosinophils Absolute: 0.2 10*3/uL (ref 0.0–0.7)
Eosinophils Relative: 2.3 % (ref 0.0–5.0)
HCT: 36.3 % (ref 36.0–46.0)
Hemoglobin: 11.6 g/dL — ABNORMAL LOW (ref 12.0–15.0)
Lymphocytes Relative: 31.2 % (ref 12.0–46.0)
Lymphs Abs: 2.3 10*3/uL (ref 0.7–4.0)
MCHC: 32 g/dL (ref 30.0–36.0)
MCV: 64 fl — ABNORMAL LOW (ref 78.0–100.0)
Monocytes Absolute: 0.3 10*3/uL (ref 0.1–1.0)
Monocytes Relative: 4.3 % (ref 3.0–12.0)
Neutro Abs: 4.5 10*3/uL (ref 1.4–7.7)
Neutrophils Relative %: 61.1 % (ref 43.0–77.0)
Platelets: 289 10*3/uL (ref 150.0–400.0)
RBC: 5.67 Mil/uL — ABNORMAL HIGH (ref 3.87–5.11)
RDW: 15.3 % (ref 11.5–15.5)
WBC: 7.4 10*3/uL (ref 4.0–10.5)

## 2022-03-31 LAB — COMPREHENSIVE METABOLIC PANEL
ALT: 10 U/L (ref 0–35)
AST: 11 U/L (ref 0–37)
Albumin: 4.5 g/dL (ref 3.5–5.2)
Alkaline Phosphatase: 55 U/L (ref 39–117)
BUN: 14 mg/dL (ref 6–23)
CO2: 31 mEq/L (ref 19–32)
Calcium: 9.5 mg/dL (ref 8.4–10.5)
Chloride: 99 mEq/L (ref 96–112)
Creatinine, Ser: 1.01 mg/dL (ref 0.40–1.20)
GFR: 58.28 mL/min — ABNORMAL LOW (ref >60.00)
Glucose, Bld: 120 mg/dL — ABNORMAL HIGH (ref 70–99)
Potassium: 4.2 mEq/L (ref 3.5–5.1)
Sodium: 137 mEq/L (ref 135–145)
Total Bilirubin: 1.2 mg/dL (ref 0.2–1.2)
Total Protein: 6.9 g/dL (ref 6.0–8.3)

## 2022-03-31 LAB — HEMOGLOBIN A1C: Hgb A1c MFr Bld: 6.9 % — ABNORMAL HIGH (ref 4.6–6.5)

## 2022-04-05 ENCOUNTER — Other Ambulatory Visit: Payer: Self-pay | Admitting: Family Medicine

## 2022-04-05 DIAGNOSIS — E1169 Type 2 diabetes mellitus with other specified complication: Secondary | ICD-10-CM

## 2022-04-05 DIAGNOSIS — I10 Essential (primary) hypertension: Secondary | ICD-10-CM

## 2022-04-05 DIAGNOSIS — E1165 Type 2 diabetes mellitus with hyperglycemia: Secondary | ICD-10-CM

## 2022-05-14 ENCOUNTER — Ambulatory Visit
Admission: RE | Admit: 2022-05-14 | Discharge: 2022-05-14 | Disposition: A | Discharge status: Home / Self Care | Payer: BLUE CROSS/BLUE SHIELD | Source: Ambulatory Visit | Attending: Family Medicine | Admitting: Family Medicine

## 2022-05-14 DIAGNOSIS — Z1231 Encounter for screening mammogram for malignant neoplasm of breast: Secondary | ICD-10-CM

## 2022-05-14 DIAGNOSIS — E2839 Other primary ovarian failure: Secondary | ICD-10-CM

## 2022-05-14 DIAGNOSIS — Z780 Asymptomatic menopausal state: Secondary | ICD-10-CM | POA: Diagnosis not present

## 2022-05-14 DIAGNOSIS — M85852 Other specified disorders of bone density and structure, left thigh: Secondary | ICD-10-CM | POA: Diagnosis not present

## 2022-06-03 ENCOUNTER — Other Ambulatory Visit: Payer: Self-pay | Admitting: Family Medicine

## 2022-06-03 DIAGNOSIS — I10 Essential (primary) hypertension: Secondary | ICD-10-CM

## 2022-07-28 DIAGNOSIS — H25043 Posterior subcapsular polar age-related cataract, bilateral: Secondary | ICD-10-CM | POA: Diagnosis not present

## 2022-07-28 DIAGNOSIS — E119 Type 2 diabetes mellitus without complications: Secondary | ICD-10-CM | POA: Diagnosis not present

## 2022-07-28 DIAGNOSIS — H2513 Age-related nuclear cataract, bilateral: Secondary | ICD-10-CM | POA: Diagnosis not present

## 2022-07-28 LAB — HM DIABETES EYE EXAM

## 2022-08-11 ENCOUNTER — Encounter: Payer: Self-pay | Admitting: Family Medicine

## 2022-08-11 ENCOUNTER — Ambulatory Visit (HOSPITAL_BASED_OUTPATIENT_CLINIC_OR_DEPARTMENT_OTHER)
Admission: RE | Admit: 2022-08-11 | Discharge: 2022-08-11 | Disposition: A | Discharge status: Home / Self Care | Payer: BLUE CROSS/BLUE SHIELD | Source: Ambulatory Visit | Attending: Family Medicine | Admitting: Family Medicine

## 2022-08-11 ENCOUNTER — Ambulatory Visit: Payer: BLUE CROSS/BLUE SHIELD | Admitting: Family Medicine

## 2022-08-11 VITALS — BP 128/70 | HR 95 | Temp 97.9°F | Resp 18 | Ht 65.0 in | Wt 229.4 lb

## 2022-08-11 DIAGNOSIS — E1169 Type 2 diabetes mellitus with other specified complication: Secondary | ICD-10-CM

## 2022-08-11 DIAGNOSIS — I10 Essential (primary) hypertension: Secondary | ICD-10-CM

## 2022-08-11 DIAGNOSIS — E785 Hyperlipidemia, unspecified: Secondary | ICD-10-CM

## 2022-08-11 DIAGNOSIS — J40 Bronchitis, not specified as acute or chronic: Secondary | ICD-10-CM | POA: Insufficient documentation

## 2022-08-11 DIAGNOSIS — R059 Cough, unspecified: Secondary | ICD-10-CM | POA: Diagnosis not present

## 2022-08-11 DIAGNOSIS — E119 Type 2 diabetes mellitus without complications: Secondary | ICD-10-CM

## 2022-08-11 MED ORDER — AZITHROMYCIN 250 MG PO TABS: ORAL_TABLET | ORAL | 0 refills | Status: DC

## 2022-08-11 MED ORDER — PROMETHAZINE-DM 6.25-15 MG/5ML PO SYRP: 5.0000 mL | ORAL_SOLUTION | Freq: Four times a day (QID) | ORAL | 0 refills | Status: DC | PRN

## 2022-08-11 NOTE — Assessment & Plan Note (Signed)
Encourage heart healthy diet such as MIND or DASH diet, increase exercise, avoid trans fats, simple carbohydrates and processed foods, consider a krill or fish or flaxseed oil cap daily.  °

## 2022-08-11 NOTE — Assessment & Plan Note (Signed)
hgba1c to be done, minimize simple carbs. Increase exercise as tolerated. Continue current meds  

## 2022-08-11 NOTE — Progress Notes (Signed)
Subjective:   By signing my name below, I, Marissa Leblanc, attest that this documentation has been prepared under the direction and in the presence of Ann Held, DO. 08/11/2022    Patient ID: Marissa Leblanc, female    DOB: 1956/04/29, 66 y.o.   MRN: [id number]  Chief Complaint  Patient presents with   Cough    Sxs started 4 weeks ago, No COVID test, pt states taking OTC Coricidin with some improvement. Pt states not taking it regularly.  Productice cough, sinus congestions, headache.    Cough Associated symptoms include headaches. Pertinent negatives include no chest pain, fever, rash or sore throat.   Patient is in today for a office visit.   She complains of a cough, headache, and sinus congestion for the past 4 weeks. Her cough keeps her awake at night. She denies having any fever or sore throat at this time. She heard wheezing one night while laying down, she has no had another episode of wheezing. She notes her sore throat has been on and off. She reports her symptoms have been on and off for the past 4 weeks but she's always had at least 1 symptoms present. [name] taking coricidin hbp to manage her symptoms. She has not taken tylenol to manage her symptoms. [name] willing to start taking her Flonase regularly to manage her symptoms. [name] allergic to penicillin.    Past Medical History:  Diagnosis Date   Hypertension     Past Surgical History:  Procedure Laterality Date   TEAR DUCT PROBING      Family History  Problem Relation Age of Onset   Hypertension Sister    Diabetes Sister    Hypertension Mother    Hypertension Father    Stroke Father    Diabetes Other    Angina Maternal Grandmother    Heart attack Maternal Grandmother     Social History   Socioeconomic History   Marital status: Divorced    Spouse name: Not on file   Number of children: Not on file   Years of education: Not on file   Highest education level: Not on file  Occupational  History   Occupation: [employer]    Employer: [employer]  Tobacco Use   Smoking status: Never   Smokeless tobacco: Never  Substance and Sexual Activity   Alcohol use: Yes    Alcohol/week: 0.0 standard drinks of alcohol    Comment: rare   Drug use: No   Sexual activity: Not Currently    Partners: Male  Other Topics Concern   Not on file  Social History Narrative   Exercise-- no---but she will start   Social Determinants of Health   Financial Resource Strain: Not on file  Food Insecurity: Not on file  Transportation Needs: Not on file  Physical Activity: Not on file  Stress: Not on file  Social Connections: Not on file  Intimate Partner Violence: Not on file    Outpatient Medications Prior to Visit  Medication Sig Dispense Refill   Accu-Chek FastClix Lancets MISC Check blood sugar twice daily. Dx:E11.9 306 each 1   ACCU-CHEK GUIDE test strip Check blood sugar twice daily. Dx: E11.9 100 each 12   blood glucose meter kit and supplies KIT Dispense based on patient and insurance preference. Use up to four times daily as directed. (FOR ICD-9 250.00, 250.01). 1 each 0   metFORMIN (GLUCOPHAGE) 500 MG tablet Take 1 tablet (500 mg total) by mouth  daily with breakfast. 90 tablet 1   perindopril (ACEON) 4 MG tablet TAKE 1 AND 1/2 TABLETS BY MOUTH EVERY DAY 135 tablet 3   triamterene-hydrochlorothiazide (MAXZIDE-25) 37.5-25 MG tablet Take 1 tablet by mouth daily. 90 tablet 1   No facility-administered medications prior to visit.    Allergies  Allergen Reactions   Penicillins Hives    Review of Systems  Constitutional:  Negative for fever.  HENT:  Positive for congestion (nasal). Negative for sore throat.   Eyes:  Negative for blurred vision.  Respiratory:  Positive for cough.   Cardiovascular:  Negative for chest pain and palpitations.  Gastrointestinal:  Negative for vomiting.  Musculoskeletal:  Negative for back pain.  Skin:  Negative for rash.  Neurological:  Positive  for headaches. Negative for loss of consciousness.       Objective:    Physical Exam Vitals and nursing note reviewed.  Constitutional:      General: [name] not in acute distress.    Appearance: Normal appearance. [name] not ill-appearing.  HENT:     Head: Normocephalic and atraumatic.     Right Ear: External ear normal.     Left Ear: External ear normal.  Eyes:     Extraocular Movements: Extraocular movements intact.     Pupils: Pupils are equal, round, and reactive to light.  Cardiovascular:     Rate and Rhythm: Normal rate and regular rhythm.     Heart sounds: Normal heart sounds. No murmur heard.    No gallop.  Pulmonary:     Effort: Pulmonary effort is normal. No respiratory distress.     Breath sounds: Decreased air movement present. Decreased breath sounds (on right side) present. No wheezing or rales.  Skin:    General: Skin is warm and dry.  Neurological:     Mental Status: [name] alert and oriented to person, place, and time.  Psychiatric:        Judgment: Judgment normal.     BP 128/70 (BP Location: Right Arm, Patient Position: Sitting, Cuff Size: Large)   Pulse 95   Temp 97.9 F (36.6 C) (Oral)   Resp 18   Ht _0  (1.651 m)   Wt 229 lb 6.4 oz (104.1 kg)   LMP 10/20/2010   SpO2 98%   BMI 38.17 kg/m  Wt Readings from Last 3 Encounters:  08/11/22 229 lb 6.4 oz (104.1 kg)  03/30/22 239 lb (108.4 kg)  11/27/21 235 lb 12.8 oz (107 kg)    Diabetic Foot Exam - Simple   No data filed    Lab Results  Component Value Date   WBC 7.4 03/30/2022   HGB 11.6 (L) 03/30/2022   HCT 36.3 03/30/2022   PLT 289.0 03/30/2022   GLUCOSE 120 (H) 03/30/2022   CHOL 161 03/30/2022   TRIG 97.0 03/30/2022   HDL 46.60 03/30/2022   LDLDIRECT 116.7 04/03/2010   LDLCALC 95 03/30/2022   ALT 10 03/30/2022   AST 11 03/30/2022   NA 137 03/30/2022   K 4.2 03/30/2022   CL 99 03/30/2022   CREATININE 1.01 03/30/2022   BUN 14 03/30/2022   CO2 31 03/30/2022   TSH 2.65  05/06/2021   HGBA1C 6.9 (H) 03/30/2022   MICROALBUR 0.8 11/28/2021    Lab Results  Component Value Date   TSH 2.65 05/06/2021   Lab Results  Component Value Date   WBC 7.4 03/30/2022   HGB 11.6 (L) 03/30/2022   HCT 36.3 03/30/2022   MCV  64.0 (L) 03/30/2022   PLT 289.0 03/30/2022   Lab Results  Component Value Date   NA 137 03/30/2022   K 4.2 03/30/2022   CO2 31 03/30/2022   GLUCOSE 120 (H) 03/30/2022   BUN 14 03/30/2022   CREATININE 1.01 03/30/2022   BILITOT 1.2 03/30/2022   ALKPHOS 55 03/30/2022   AST 11 03/30/2022   ALT 10 03/30/2022   PROT 6.9 03/30/2022   ALBUMIN 4.5 03/30/2022   CALCIUM 9.5 03/30/2022   GFR 58.28 (L) 03/30/2022   Lab Results  Component Value Date   CHOL 161 03/30/2022   Lab Results  Component Value Date   HDL 46.60 03/30/2022   Lab Results  Component Value Date   LDLCALC 95 03/30/2022   Lab Results  Component Value Date   TRIG 97.0 03/30/2022   Lab Results  Component Value Date   CHOLHDL 3 03/30/2022   Lab Results  Component Value Date   HGBA1C 6.9 (H) 03/30/2022       Assessment & Plan:   Problem List Items Addressed This Visit       Unprioritized   Type 2 diabetes mellitus without complication, without long-term current use of insulin ([place])    hgba1c to be done, minimize simple carbs. Increase exercise as tolerated. Continue current meds       Hyperlipidemia associated with type 2 diabetes mellitus ([place])    Encourage heart healthy diet such as MIND or DASH diet, increase exercise, avoid trans fats, simple carbohydrates and processed foods, consider a krill or fish or flaxseed oil cap daily.        Essential hypertension    Well controlled, no changes to meds. Encouraged heart healthy diet such as the DASH diet and exercise as tolerated.        Other Visit Diagnoses     Bronchitis    -  Primary   Relevant Medications   azithromycin (ZITHROMAX Z-PAK) 250 MG tablet   promethazine-dextromethorphan  (PROMETHAZINE-DM) 6.25-15 MG/5ML syrup   Other Relevant Orders   DG Chest 2 View (Completed)        Meds ordered this encounter  Medications   azithromycin (ZITHROMAX Z-PAK) 250 MG tablet    Sig: As directed    Dispense:  6 each    Refill:  0   promethazine-dextromethorphan (PROMETHAZINE-DM) 6.25-15 MG/5ML syrup    Sig: Take 5 mLs by mouth 4 (four) times daily as needed.    Dispense:  118 mL    Refill:  0    I, Ann Held, DO, personally preformed the services described in this documentation.  All medical record entries made by the scribe were at my direction and in my presence.  I have reviewed the chart and discharge instructions (if applicable) and agree that the record reflects my personal performance and is accurate and complete. 08/11/2022   I,Marissa Leblanc,acting as a scribe for Ann Held, DO.,have documented all relevant documentation on the behalf of Ann Held, DO,as directed by  Ann Held, DO while in the presence of Ann Held, DO.   Ann Held, DO

## 2022-08-11 NOTE — Assessment & Plan Note (Signed)
Well controlled, no changes to meds. Encouraged heart healthy diet such as the DASH diet and exercise as tolerated.  °

## 2022-08-11 NOTE — Assessment & Plan Note (Signed)
Check xray Take abx and cough syrup at night  May con't coricidin during the day F/u with prn

## 2022-08-26 ENCOUNTER — Encounter: Payer: Self-pay | Admitting: Family Medicine

## 2022-08-28 ENCOUNTER — Ambulatory Visit: Payer: BLUE CROSS/BLUE SHIELD | Admitting: Family Medicine

## 2022-08-28 ENCOUNTER — Encounter: Payer: Self-pay | Admitting: Family Medicine

## 2022-08-28 ENCOUNTER — Other Ambulatory Visit (HOSPITAL_COMMUNITY)
Admission: RE | Admit: 2022-08-28 | Discharge: 2022-08-28 | Disposition: A | Discharge status: Home / Self Care | Payer: BLUE CROSS/BLUE SHIELD | Source: Ambulatory Visit | Attending: Family Medicine | Admitting: Family Medicine

## 2022-08-28 VITALS — BP 118/70 | HR 83 | Temp 98.9°F | Resp 18 | Ht 65.0 in | Wt 231.4 lb

## 2022-08-28 DIAGNOSIS — E119 Type 2 diabetes mellitus without complications: Secondary | ICD-10-CM

## 2022-08-28 DIAGNOSIS — N760 Acute vaginitis: Secondary | ICD-10-CM | POA: Diagnosis not present

## 2022-08-28 LAB — POC URINALSYSI DIPSTICK (AUTOMATED)
Bilirubin, UA: NEGATIVE
Blood, UA: NEGATIVE
Glucose, UA: NEGATIVE
Ketones, UA: NEGATIVE
Nitrite, UA: NEGATIVE
Protein, UA: NEGATIVE
Spec Grav, UA: 1.02 (ref 1.010–1.025)
Urobilinogen, UA: 0.2 E.U./dL
pH, UA: 6 (ref 5.0–8.0)

## 2022-08-28 MED ORDER — METFORMIN HCL 500 MG PO TABS: 500.0000 mg | ORAL_TABLET | Freq: Every day | ORAL | 1 refills | Status: DC

## 2022-08-28 NOTE — Progress Notes (Signed)
   Established Patient Office Visit  Subjective   Patient ID: Marissa Leblanc, female    DOB: [birth year]  Age: 66 y.o. MRN: [id number]  Chief Complaint  Patient presents with   Vaginal Discharge    Pt states sxs started Wednesday. Pt states having discharge and cramping. No other sxs. No burning with urination.    HPI Pt here c/o d/c brown since tuesday {History (Optional):23778}  ROS    Objective:     BP 118/70 (BP Location: Left Arm, Patient Position: Sitting, Cuff Size: Large)   Pulse 83   Temp 98.9 F (37.2 C) (Oral)   Resp 18   Ht 5\' 5"  (1.651 m)   Wt 231 lb 6.4 oz (105 kg)   LMP 10/20/2010   SpO2 98%   BMI 38.51 kg/m  {Vitals History (Optional):23777}  Physical Exam   No results found for any visits on 08/28/22.  {Labs (Optional):23779}  The 10-year ASCVD risk score (Arnett DK, et al., 2019) is: 12.7%    Assessment & Plan:   Problem List Items Addressed This Visit       Unprioritized   Type 2 diabetes mellitus without complication, without long-term current use of insulin ([facility]) - Primary   Other Visit Diagnoses     Acute vaginitis           No follow-ups on file.    2020, DO

## 2022-08-28 NOTE — Patient Instructions (Signed)

## 2022-08-29 ENCOUNTER — Encounter: Payer: Self-pay | Admitting: Family Medicine

## 2022-08-29 DIAGNOSIS — N760 Acute vaginitis: Secondary | ICD-10-CM | POA: Insufficient documentation

## 2022-08-29 LAB — URINE CULTURE
MICRO NUMBER:: 14321513
SPECIMEN QUALITY:: ADEQUATE

## 2022-08-29 NOTE — Assessment & Plan Note (Signed)
Self swab  Urine and culture

## 2022-08-31 ENCOUNTER — Other Ambulatory Visit: Payer: Self-pay | Admitting: Family Medicine

## 2022-08-31 DIAGNOSIS — N760 Acute vaginitis: Secondary | ICD-10-CM

## 2022-08-31 LAB — CERVICOVAGINAL ANCILLARY ONLY
Bacterial Vaginitis (gardnerella): NEGATIVE
Candida Glabrata: NEGATIVE
Candida Vaginitis: POSITIVE — AB
Chlamydia: NEGATIVE
Comment: NEGATIVE
Comment: NEGATIVE
Comment: NEGATIVE
Comment: NEGATIVE
Comment: NEGATIVE
Comment: NORMAL
Neisseria Gonorrhea: NEGATIVE
Trichomonas: NEGATIVE

## 2022-08-31 MED ORDER — FLUCONAZOLE 150 MG PO TABS: 150.0000 mg | ORAL_TABLET | Freq: Every day | ORAL | 1 refills | Status: DC

## 2022-09-18 ENCOUNTER — Other Ambulatory Visit: Payer: Self-pay | Admitting: Family Medicine

## 2022-09-18 ENCOUNTER — Telehealth: Payer: Self-pay | Admitting: Family Medicine

## 2022-09-18 DIAGNOSIS — F4321 Adjustment disorder with depressed mood: Secondary | ICD-10-CM

## 2022-09-18 MED ORDER — ALPRAZOLAM 0.25 MG PO TABS: 0.2500 mg | ORAL_TABLET | Freq: Two times a day (BID) | ORAL | 0 refills | Status: DC | PRN

## 2022-09-18 NOTE — Telephone Encounter (Signed)
Patient's daughter passed away on Jan 16, 2023 and patient wants to know if the doctor could call in some xanax or something for her. She hasn't been able to sleep at all. Patient uses [facility] on [address]. Please advise patient.

## 2022-09-18 NOTE — Telephone Encounter (Signed)
Pt called. LVM advising of medication

## 2022-11-27 ENCOUNTER — Other Ambulatory Visit: Payer: Self-pay | Admitting: Family Medicine

## 2022-11-27 DIAGNOSIS — I10 Essential (primary) hypertension: Secondary | ICD-10-CM

## 2023-04-06 ENCOUNTER — Telehealth: Payer: Self-pay | Admitting: Family Medicine

## 2023-04-06 DIAGNOSIS — I10 Essential (primary) hypertension: Secondary | ICD-10-CM

## 2023-04-06 MED ORDER — PERINDOPRIL ERBUMINE 4 MG PO TABS: ORAL_TABLET | ORAL | 1 refills | Status: DC

## 2023-04-06 NOTE — Addendum Note (Signed)
Addended by: Roxanne Gates on: 04/06/2023 10:44 AM   Modules accepted: Orders

## 2023-04-06 NOTE — Telephone Encounter (Signed)
Rx sent 

## 2023-04-06 NOTE — Telephone Encounter (Signed)
**  Pt has been scheduled for CPE on 7.25.24**  Prescription Request  04/06/2023  Is this a "Controlled Substance" medicine? No  LOV: Visit date not found  What is the name of the medication or equipment?   perindopril (ACEON) 4 MG tablet [[id number]]   Have you contacted your pharmacy to request a refill? Yes   Which pharmacy would you like this sent to?  [id number] [place], [place] - [phone] AT [facility] [address] [facility] [address] [place] [address] Phone: [phone] Fax: [phone]  Patient notified that their request is being sent to the clinical staff for review and that they should receive a response within 2 business days.   Please advise at Mobile [phone] (mobile)

## 2023-04-08 ENCOUNTER — Encounter: Payer: Self-pay | Admitting: Family Medicine

## 2023-04-08 ENCOUNTER — Ambulatory Visit (INDEPENDENT_AMBULATORY_CARE_PROVIDER_SITE_OTHER): Payer: BC Managed Care – PPO | Admitting: Family Medicine

## 2023-04-08 VITALS — BP 110/60 | HR 75 | Temp 98.8°F | Resp 18 | Ht 65.0 in | Wt 211.2 lb

## 2023-04-08 DIAGNOSIS — E1165 Type 2 diabetes mellitus with hyperglycemia: Secondary | ICD-10-CM | POA: Diagnosis not present

## 2023-04-08 DIAGNOSIS — Z1211 Encounter for screening for malignant neoplasm of colon: Secondary | ICD-10-CM

## 2023-04-08 DIAGNOSIS — E119 Type 2 diabetes mellitus without complications: Secondary | ICD-10-CM | POA: Diagnosis not present

## 2023-04-08 DIAGNOSIS — I10 Essential (primary) hypertension: Secondary | ICD-10-CM | POA: Diagnosis not present

## 2023-04-08 DIAGNOSIS — E1169 Type 2 diabetes mellitus with other specified complication: Secondary | ICD-10-CM | POA: Diagnosis not present

## 2023-04-08 DIAGNOSIS — Z0000 Encounter for general adult medical examination without abnormal findings: Secondary | ICD-10-CM | POA: Diagnosis not present

## 2023-04-08 DIAGNOSIS — E785 Hyperlipidemia, unspecified: Secondary | ICD-10-CM | POA: Diagnosis not present

## 2023-04-08 DIAGNOSIS — F4321 Adjustment disorder with depressed mood: Secondary | ICD-10-CM | POA: Diagnosis not present

## 2023-04-08 DIAGNOSIS — Z634 Disappearance and death of family member: Secondary | ICD-10-CM

## 2023-04-08 LAB — CBC WITH DIFFERENTIAL/PLATELET
Basophils Absolute: 0 10*3/uL (ref 0.0–0.1)
Basophils Relative: 0.7 % (ref 0.0–3.0)
Eosinophils Absolute: 0.2 10*3/uL (ref 0.0–0.7)
Eosinophils Relative: 2.8 % (ref 0.0–5.0)
HCT: 41.2 % (ref 36.0–46.0)
Hemoglobin: 12.5 g/dL (ref 12.0–15.0)
Lymphocytes Relative: 29.9 % (ref 12.0–46.0)
Lymphs Abs: 1.7 10*3/uL (ref 0.7–4.0)
MCHC: 30.3 g/dL (ref 30.0–36.0)
MCV: 64.2 fl — ABNORMAL LOW (ref 78.0–100.0)
Monocytes Absolute: 0.3 10*3/uL (ref 0.1–1.0)
Monocytes Relative: 4.8 % (ref 3.0–12.0)
Neutro Abs: 3.4 10*3/uL (ref 1.4–7.7)
Neutrophils Relative %: 61.8 % (ref 43.0–77.0)
Platelets: 287 10*3/uL (ref 150.0–400.0)
RBC: 6.42 Mil/uL — ABNORMAL HIGH (ref 3.87–5.11)
RDW: 15.5 % (ref 11.5–15.5)
WBC: 5.6 10*3/uL (ref 4.0–10.5)

## 2023-04-08 LAB — COMPREHENSIVE METABOLIC PANEL
ALT: 8 U/L (ref 0–35)
AST: 11 U/L (ref 0–37)
Albumin: 4.5 g/dL (ref 3.5–5.2)
Alkaline Phosphatase: 53 U/L (ref 39–117)
BUN: 18 mg/dL (ref 6–23)
CO2: 33 mEq/L — ABNORMAL HIGH (ref 19–32)
Calcium: 9.7 mg/dL (ref 8.4–10.5)
Chloride: 99 mEq/L (ref 96–112)
Creatinine, Ser: 1.18 mg/dL (ref 0.40–1.20)
GFR: 48.01 mL/min — ABNORMAL LOW (ref >60.00)
Glucose, Bld: 119 mg/dL — ABNORMAL HIGH (ref 70–99)
Potassium: 3.6 mEq/L (ref 3.5–5.1)
Sodium: 140 mEq/L (ref 135–145)
Total Bilirubin: 1 mg/dL (ref 0.2–1.2)
Total Protein: 6.9 g/dL (ref 6.0–8.3)

## 2023-04-08 LAB — LIPID PANEL
Cholesterol: 190 mg/dL (ref 0–200)
HDL: 54.4 mg/dL (ref >39.00)
LDL Cholesterol: 120 mg/dL — ABNORMAL HIGH (ref 0–99)
NonHDL: 135.52
Total CHOL/HDL Ratio: 3
Triglycerides: 77 mg/dL (ref 0.0–149.0)
VLDL: 15.4 mg/dL (ref 0.0–40.0)

## 2023-04-08 LAB — HEMOGLOBIN A1C: Hgb A1c MFr Bld: 6.2 % (ref 4.6–6.5)

## 2023-04-08 LAB — MICROALBUMIN / CREATININE URINE RATIO
Creatinine,U: 71.2 mg/dL
Microalb Creat Ratio: 2.7 mg/g (ref 0.0–30.0)
Microalb, Ur: 1.9 mg/dL (ref 0.0–1.9)

## 2023-04-08 MED ORDER — TRIAMTERENE-HCTZ 37.5-25 MG PO TABS: 1.0000 | ORAL_TABLET | Freq: Every day | ORAL | 0 refills | Status: DC

## 2023-04-08 MED ORDER — METFORMIN HCL 500 MG PO TABS: 500.0000 mg | ORAL_TABLET | Freq: Every day | ORAL | 1 refills | Status: DC

## 2023-04-08 NOTE — Patient Instructions (Signed)
Preventive Care 65 Years and Older, Female Preventive care refers to lifestyle choices and visits with your health care provider that can promote health and wellness. Preventive care visits are also called wellness exams. What can I expect for my preventive care visit? Counseling Your health care provider may ask you questions about your: Medical history, including: Past medical problems. Family medical history. Pregnancy and menstrual history. History of falls. Current health, including: Memory and ability to understand (cognition). Emotional well-being. Home life and relationship well-being. Sexual activity and sexual health. Lifestyle, including: Alcohol, nicotine or tobacco, and drug use. Access to firearms. Diet, exercise, and sleep habits. Work and work environment. Sunscreen use. Safety issues such as seatbelt and bike helmet use. Physical exam Your health care provider will check your: Height and weight. These may be used to calculate your BMI (body mass index). BMI is a measurement that tells if you are at a healthy weight. Waist circumference. This measures the distance around your waistline. This measurement also tells if you are at a healthy weight and may help predict your risk of certain diseases, such as type 2 diabetes and high blood pressure. Heart rate and blood pressure. Body temperature. Skin for abnormal spots. What immunizations do I need?  Vaccines are usually given at various ages, according to a schedule. Your health care provider will recommend vaccines for you based on your age, medical history, and lifestyle or other factors, such as travel or where you work. What tests do I need? Screening Your health care provider may recommend screening tests for certain conditions. This may include: Lipid and cholesterol levels. Hepatitis C test. Hepatitis B test. HIV (human immunodeficiency virus) test. STI (sexually transmitted infection) testing, if you are at  risk. Lung cancer screening. Colorectal cancer screening. Diabetes screening. This is done by checking your blood sugar (glucose) after you have not eaten for a while (fasting). Mammogram. Talk with your health care provider about how often you should have regular mammograms. BRCA-related cancer screening. This may be done if you have a family history of breast, ovarian, tubal, or peritoneal cancers. Bone density scan. This is done to screen for osteoporosis. Talk with your health care provider about your test results, treatment options, and if necessary, the need for more tests. Follow these instructions at home: Eating and drinking  Eat a diet that includes fresh fruits and vegetables, whole grains, lean protein, and low-fat dairy products. Limit your intake of foods with high amounts of sugar, saturated fats, and salt. Take vitamin and mineral supplements as recommended by your health care provider. Do not drink alcohol if your health care provider tells you not to drink. If you drink alcohol: Limit how much you have to 0-1 drink a day. Know how much alcohol is in your drink. In the [place] one drink equals one 12 oz bottle of beer (355 mL), one 5 oz glass of wine (148 mL), or one 1 oz glass of hard liquor (44 mL). Lifestyle Brush your teeth every morning and night with fluoride toothpaste. Floss one time each day. Exercise for at least 30 minutes 5 or more days each week. Do not use any products that contain nicotine or tobacco. These products include cigarettes, chewing tobacco, and vaping devices, such as e-cigarettes. If you need help quitting, ask your health care provider. Do not use drugs. If you are sexually active, practice safe sex. Use a condom or other form of protection in order to prevent STIs. Take aspirin only as told by   your health care provider. Make sure that you understand how much to take and what form to take. Work with your health care provider to find out whether it  is safe and beneficial for you to take aspirin daily. Ask your health care provider if you need to take a cholesterol-lowering medicine (statin). Find healthy ways to manage stress, such as: Meditation, yoga, or listening to music. Journaling. Talking to a trusted person. Spending time with friends and family. Minimize exposure to UV radiation to reduce your risk of skin cancer. Safety Always wear your seat belt while driving or riding in a vehicle. Do not drive: If you have been drinking alcohol. Do not ride with someone who has been drinking. When you are tired or distracted. While texting. If you have been using any mind-altering substances or drugs. Wear a helmet and other protective equipment during sports activities. If you have firearms in your house, make sure you follow all gun safety procedures. What's next? Visit your health care provider once a year for an annual wellness visit. Ask your health care provider how often you should have your eyes and teeth checked. Stay up to date on all vaccines. This information is not intended to replace advice given to you by your health care provider. Make sure you discuss any questions you have with your health care provider. Document Revised: 02/26/2021 Document Reviewed: 02/26/2021 Elsevier Patient Education  2024 [employer]  

## 2023-04-08 NOTE — Assessment & Plan Note (Signed)
hgba1c to be checked, minimize simple carbs. Increase exercise as tolerated. Continue current meds  

## 2023-04-08 NOTE — Assessment & Plan Note (Signed)
Encourage heart healthy diet such as MIND or DASH diet, increase exercise, avoid trans fats, simple carbohydrates and processed foods, consider a krill or fish or flaxseed oil cap daily.  °

## 2023-04-08 NOTE — Assessment & Plan Note (Signed)
Well controlled, no changes to meds. Encouraged heart healthy diet such as the DASH diet and exercise as tolerated.  °

## 2023-04-08 NOTE — Progress Notes (Signed)
Established Patient Office Visit  Subjective   Patient ID: Marissa Leblanc, female    DOB: [birth year]  Age: 67 y.o. MRN: [id number]  Chief Complaint  Patient presents with   Annual Exam    Pt states fasting     HPI Discussed the use of AI scribe software for clinical note transcription with the patient, who gave verbal consent to proceed.  History of Present Illness   The patient presents with profound grief following the sudden and unexplained death of their 28 year old daughter in January. The daughter was reportedly healthy, on birth control, and had no known medical conditions. She was found deceased in bed, with some blood on her pillowcase. The patient suspects a blood clot or aneurysm as the cause of death, but an autopsy report is still pending.   The patient reports that they have been managing their grief without professional counseling, but acknowledges that some days are harder than others. They have not sought out grief counseling or support groups, but are considering it.  The patient also has a history of diabetes and hypertension, which are managed with perindopril and metformin. They report that their blood sugar levels have been stable.      Patient Active Problem List   Diagnosis Date Noted   Acute vaginitis 08/29/2022   Bronchitis 08/11/2022   Acute pain of right knee 03/04/2020   Hyperlipidemia associated with type 2 diabetes mellitus (HCC) 03/04/2020   Uncontrolled type 2 diabetes mellitus with hyperglycemia (HCC) 03/04/2020   Close exposure to COVID-19 virus 03/09/2019   Acute serous otitis media of left ear 04/12/2017   Type 2 diabetes mellitus without complication, without long-term current use of insulin (HCC) 01/21/2017   Obesity (BMI 30-39.9) 05/10/2013   KNEE PAIN, LEFT 08/22/2010   POSTMENOPAUSAL STATUS  04/03/2010   WOUND, FINGER 05/21/2009   URI 08/24/2007   Essential hypertension 12/08/2006   Past Medical History:  Diagnosis Date   Hypertension    Past Surgical History:  Procedure Laterality Date   TEAR DUCT PROBING     Social History   Tobacco Use   Smoking status: Never   Smokeless tobacco: Never  Substance Use Topics   Alcohol use: Yes    Alcohol/week: 0.0 standard drinks of alcohol    Comment: rare   Drug use: No   Social History   Socioeconomic History   Marital status: Divorced    Spouse name: Not on file   Number of children: Not on file   Years of education: Not on file   Highest education level: Not on file  Occupational History   Occupation: [employer]  Tobacco Use   Smoking status: Never   Smokeless tobacco: Never  Substance and Sexual Activity   Alcohol use: Yes    Alcohol/week: 0.0 standard drinks of alcohol    Comment: rare   Drug use: No   Sexual activity: Not Currently    Partners: Male  Other Topics Concern   Not on file  Social History Narrative   Exercise-- treadmill 10 min a day --- trying to increase  gradually   Social Determinants of Health   Financial Resource Strain: Not on file  Food Insecurity: Not on file  Transportation Needs: Not on file  Physical Activity: Not on file  Stress: Not on file  Social Connections: Not on file  Intimate Partner Violence: Not on file   Family Status  Relation Name Status   Mother  Alive   Father  Deceased       PE   Sister  Alive   MGM  (Not Specified)   Other  (Not Specified)   Daughter  Deceased  No partnership data on file   Family History  Problem Relation Age of Onset   Hypertension Mother    Hypertension Father    Stroke Father    Hypertension Sister    Diabetes Sister    Angina Maternal Grandmother    Heart attack Maternal Grandmother    Diabetes Other    Allergies  Allergen Reactions   Penicillins Hives    Review of Systems  Constitutional:  Negative for fever and  malaise/fatigue.  HENT:  Negative for congestion.   Eyes:  Negative for blurred vision.  Respiratory:  Negative for cough and shortness of breath.   Cardiovascular:  Negative for chest pain, palpitations and leg swelling.  Gastrointestinal:  Negative for vomiting.  Musculoskeletal:  Negative for back pain.  Skin:  Negative for rash.  Neurological:  Negative for loss of consciousness and headaches.      Objective:     BP 110/60 (BP Location: Right Arm, Patient Position: Sitting, Cuff Size: Large)   Pulse 75   Temp 98.8 F (37.1 C) (Oral)   Resp 18   Ht 5\' 5"  (1.651 m)   Wt 211 lb 3.2 oz (95.8 kg)   LMP 10/20/2010   SpO2 97%   BMI 35.15 kg/m  BP Readings from Last 3 Encounters:  04/08/23 110/60  08/28/22 118/70  08/11/22 128/70   Wt Readings from Last 3 Encounters:  04/08/23 211 lb 3.2 oz (95.8 kg)  08/28/22 231 lb 6.4 oz (105 kg)  08/11/22 229 lb 6.4 oz (104.1 kg)   SpO2 Readings from Last 3 Encounters:  04/08/23 97%  08/28/22 98%  08/11/22 98%      Physical Exam Vitals and nursing note reviewed.  Constitutional:      General: [name] not in acute distress.    Appearance: Normal appearance. [name] well-developed.  HENT:     Head: Normocephalic and atraumatic.     Right Ear: Tympanic membrane, ear canal and external ear normal. There is no impacted cerumen.     Left Ear: Tympanic membrane, ear canal and external ear normal. There is no impacted cerumen.     Nose: Nose normal.     Mouth/Throat:     Mouth: Mucous membranes are moist.     Pharynx: Oropharynx is clear. No oropharyngeal exudate or posterior oropharyngeal erythema.  Eyes:     General: No scleral icterus.       Right eye: No discharge.        Left eye: No discharge.     Conjunctiva/sclera: Conjunctivae normal.     Pupils: Pupils are equal, round, and reactive to light.  Neck:     Thyroid: No thyromegaly or thyroid tenderness.     Vascular: No JVD.  Cardiovascular:     Rate and Rhythm: Normal  rate and regular rhythm.     Heart sounds: Normal heart sounds. No murmur heard. Pulmonary:  Effort: Pulmonary effort is normal. No respiratory distress.     Breath sounds: Normal breath sounds.  Abdominal:     General: Bowel sounds are normal. There is no distension.     Palpations: Abdomen is soft. There is no mass.     Tenderness: There is no abdominal tenderness. There is no guarding or rebound.  Genitourinary:    Vagina: Normal.  Musculoskeletal:        General: Normal range of motion.     Cervical back: Normal range of motion and neck supple.     Right lower leg: No edema.     Left lower leg: No edema.  Lymphadenopathy:     Cervical: No cervical adenopathy.  Skin:    General: Skin is warm and dry.     Findings: No erythema or rash.  Neurological:     General: No focal deficit present.     Mental Status: [name] alert and oriented to person, place, and time.     Cranial Nerves: No cranial nerve deficit.     Deep Tendon Reflexes: Reflexes are normal and symmetric.  Psychiatric:        Mood and Affect: Mood normal. Affect is tearful.        Speech: Speech normal.        Behavior: Behavior normal.        Thought Content: Thought content normal.        Judgment: Judgment normal.      No results found for any visits on 04/08/23.  Last CBC Lab Results  Component Value Date   WBC 7.4 03/30/2022   HGB 11.6 (L) 03/30/2022   HCT 36.3 03/30/2022   MCV 64.0 (L) 03/30/2022   RDW 15.3 03/30/2022   PLT 289.0 03/30/2022   Last metabolic panel Lab Results  Component Value Date   GLUCOSE 120 (H) 03/30/2022   NA 137 03/30/2022   K 4.2 03/30/2022   CL 99 03/30/2022   CO2 31 03/30/2022   BUN 14 03/30/2022   CREATININE 1.01 03/30/2022   GFR 58.28 (L) 03/30/2022   CALCIUM 9.5 03/30/2022   PROT 6.9 03/30/2022   ALBUMIN 4.5 03/30/2022   BILITOT 1.2 03/30/2022   ALKPHOS 55 03/30/2022   AST 11 03/30/2022   ALT 10 03/30/2022   Last lipids Lab Results  Component Value  Date   CHOL 161 03/30/2022   HDL 46.60 03/30/2022   LDLCALC 95 03/30/2022   LDLDIRECT 116.7 04/03/2010   TRIG 97.0 03/30/2022   CHOLHDL 3 03/30/2022   Last hemoglobin A1c Lab Results  Component Value Date   HGBA1C 6.9 (H) 03/30/2022   Last thyroid functions Lab Results  Component Value Date   TSH 2.65 05/06/2021   Last vitamin D Lab Results  Component Value Date   VD25OH 22 (L) 04/03/2010   Last vitamin B12 and Folate Lab Results  Component Value Date   VITAMINB12 232 12/20/2017      The 10-year ASCVD risk score (Arnett DK, et al., 2019) is: 11.1%    Assessment & Plan:   Problem List Items Addressed This Visit       Unprioritized   Type 2 diabetes mellitus without complication, without long-term current use of insulin (HCC)   Relevant Medications   metFORMIN (GLUCOPHAGE) 500 MG tablet   Other Relevant Orders   Hemoglobin A1c   Uncontrolled type 2 diabetes mellitus with hyperglycemia (HCC)    hgba1c to be checked, minimize simple carbs. Increase exercise as tolerated. Continue current  meds       Relevant Medications   metFORMIN (GLUCOPHAGE) 500 MG tablet   Hyperlipidemia associated with type 2 diabetes mellitus (HCC)    Encourage heart healthy diet such as MIND or DASH diet, increase exercise, avoid trans fats, simple carbohydrates and processed foods, consider a krill or fish or flaxseed oil cap daily.        Relevant Medications   triamterene-hydrochlorothiazide (MAXZIDE-25) 37.5-25 MG tablet   metFORMIN (GLUCOPHAGE) 500 MG tablet   Other Relevant Orders   CBC with Differential/Platelet   Comprehensive metabolic panel   Essential hypertension    Well controlled, no changes to meds. Encouraged heart healthy diet such as the DASH diet and exercise as tolerated.        Relevant Medications   triamterene-hydrochlorothiazide (MAXZIDE-25) 37.5-25 MG tablet   Other Relevant Orders   Comprehensive metabolic panel   Microalbumin / creatinine urine ratio    Other Visit Diagnoses     Preventative health care    -  Primary   Relevant Orders   Lipid panel   CBC with Differential/Platelet   Comprehensive metabolic panel   Hemoglobin A1c   Microalbumin / creatinine urine ratio   Grief at loss of child       Type 2 diabetes mellitus with hyperglycemia, without long-term current use of insulin (HCC)       Relevant Medications   metFORMIN (GLUCOPHAGE) 500 MG tablet   Other Relevant Orders   Comprehensive metabolic panel   Hemoglobin A1c   Colon cancer screening       Relevant Orders   Ambulatory referral to Gastroenterology     Assessment and Plan    Grief: Patient is experiencing grief due to the sudden loss of her daughter. She has not sought grief counseling yet. -Consider grief counseling or support groups when ready.  Type 2 Diabetes: Patient is currently taking metformin 500mg  daily. Blood glucose levels are controlled. -Continue metformin 500mg  daily. -Check blood glucose levels regularly.  Hypertension: Patient is currently taking perindopril. No complaints of symptoms related to hypertension. -Continue perindopril as prescribed.  General Health Maintenance: -Encouraged to consider colonoscopy due to age. -Regular eye and dental check-ups reported. -Regular exercise (10 minutes daily on treadmill) reported. -Continue regular foot checks due to diabetes. -Follow-up as needed.        Return in about 6 months (around 10/09/2023), or if symptoms worsen or fail to improve, for diabetes II, hyperlipidemia, hypertension.    Donato Schultz, DO

## 2023-04-09 ENCOUNTER — Other Ambulatory Visit: Payer: Self-pay

## 2023-04-09 MED ORDER — ROSUVASTATIN CALCIUM 10 MG PO TABS: 10.0000 mg | ORAL_TABLET | Freq: Every day | ORAL | 2 refills | Status: DC

## 2023-06-18 ENCOUNTER — Other Ambulatory Visit: Payer: Self-pay | Admitting: Family Medicine

## 2023-06-18 DIAGNOSIS — Z1212 Encounter for screening for malignant neoplasm of rectum: Secondary | ICD-10-CM

## 2023-06-18 DIAGNOSIS — Z1211 Encounter for screening for malignant neoplasm of colon: Secondary | ICD-10-CM

## 2023-06-25 ENCOUNTER — Telehealth: Payer: Self-pay | Admitting: Family Medicine

## 2023-06-25 DIAGNOSIS — I10 Essential (primary) hypertension: Secondary | ICD-10-CM

## 2023-06-25 MED ORDER — TRIAMTERENE-HCTZ 37.5-25 MG PO TABS: 1.0000 | ORAL_TABLET | Freq: Every day | ORAL | 1 refills | Status: DC

## 2023-06-25 NOTE — Addendum Note (Signed)
Addended by: Roxanne Gates on: 06/25/2023 03:17 PM   Modules accepted: Orders

## 2023-06-25 NOTE — Telephone Encounter (Signed)
Rx sent 

## 2023-06-25 NOTE — Telephone Encounter (Signed)
Pt said she is totally out of her triamterene-hydrochlorothiazide (MAXZIDE-25) 37.5-25 MG tablet. Pt said that she called in Tuesday but hadn't heard back. Per patient, The pharmacy reached out to [place]. Pt requesting further refills because each month she has to call in and there is always an issue.

## 2023-07-14 DIAGNOSIS — Z1212 Encounter for screening for malignant neoplasm of rectum: Secondary | ICD-10-CM | POA: Diagnosis not present

## 2023-07-14 DIAGNOSIS — Z1211 Encounter for screening for malignant neoplasm of colon: Secondary | ICD-10-CM | POA: Diagnosis not present

## 2023-07-22 LAB — COLOGUARD: COLOGUARD: NEGATIVE

## 2023-10-04 ENCOUNTER — Other Ambulatory Visit: Payer: Self-pay | Admitting: Family Medicine

## 2023-10-04 DIAGNOSIS — I10 Essential (primary) hypertension: Secondary | ICD-10-CM

## 2023-10-04 NOTE — Telephone Encounter (Signed)
Copied from CRM [id number]. Topic: Clinical - Medication Refill >> Oct 04, 2023 11:34 AM Pascal Lux wrote: Most Recent Primary Care Visit:  Provider: Seabron Spates R  Department: [facility]  Visit Type: PHYSICAL  Date: 04/08/2023  Medication: perindopril (ACEON) 4 MG tablet [[id number]]  Has the patient contacted their pharmacy? Yes (Agent: If no, request that the patient contact the pharmacy for the refill. If patient does not wish to contact the pharmacy document the reason why and proceed with request.) (Agent: If yes, when and what did the pharmacy advise?)  Is this the correct pharmacy for this prescription? Yes If no, delete pharmacy and type the correct one.  This is the patient's preferred pharmacy:  [id number] [place], [place] - [phone] AT [facility] OF [facility] & [address] [place] [address] Phone: [phone] Fax: [phone]    Has the prescription been filled recently? Yes  Is the patient out of the medication? Yes  Has the patient been seen for an appointment in the last year OR does the patient have an upcoming appointment? Yes  Can we respond through MyChart? Yes  Agent: Please be advised that Rx refills may take up to 3 business days. We ask that you follow-up with your pharmacy.

## 2023-10-13 ENCOUNTER — Other Ambulatory Visit: Payer: Self-pay | Admitting: Family Medicine

## 2023-10-13 DIAGNOSIS — E119 Type 2 diabetes mellitus without complications: Secondary | ICD-10-CM

## 2023-10-13 MED ORDER — METFORMIN HCL 500 MG PO TABS: 500.0000 mg | ORAL_TABLET | Freq: Every day | ORAL | 0 refills | Status: DC

## 2023-10-13 NOTE — Telephone Encounter (Signed)
Rx sent

## 2023-10-13 NOTE — Telephone Encounter (Signed)
Copied from CRM [id number]. Topic: Clinical - Medication Refill >> Oct 13, 2023  2:17 PM Tiffany H wrote: Most Recent Primary Care Visit:  Provider: Seabron Spates R  Department: [facility]  Visit Type: PHYSICAL  Date: 04/08/2023  Medication: metFORMIN (GLUCOPHAGE) 500 MG tablet  Has the patient contacted their pharmacy? Yes (Agent: If no, request that the patient contact the pharmacy for the refill. If patient does not wish to contact the pharmacy document the reason why and proceed with request.) (Agent: If yes, when and what did the pharmacy advise?)  Is this the correct pharmacy for this prescription? Yes If no, delete pharmacy and type the correct one.  This is the patient's preferred pharmacy:    [facility] #[id number] [place], [place] - [phone] AT [facility] OF [facility] & [address] [place] [address] Phone: [phone] Fax: [phone]  Has the prescription been filled recently? Yes  Is the patient out of the medication? Yes  Has the patient been seen for an appointment in the last year OR does the patient have an upcoming appointment? Yes  Can we respond through MyChart? Yes  Agent: Please be advised that Rx refills may take up to 3 business days. We ask that you follow-up with your pharmacy.

## 2023-10-13 NOTE — Telephone Encounter (Signed)
Copied from CRM [id number]. Topic: Clinical - Medication Refill >> Oct 13, 2023  2:10 PM Tiffany H wrote: Patient/patient representative is calling to schedule an appointment. Refer to attachments for appointment information.

## 2023-10-15 ENCOUNTER — Ambulatory Visit: Payer: BC Managed Care – PPO | Admitting: Family Medicine

## 2023-10-15 VITALS — BP 118/70 | HR 74 | Temp 97.7°F | Resp 18 | Ht 65.0 in | Wt 224.4 lb

## 2023-10-15 DIAGNOSIS — E1165 Type 2 diabetes mellitus with hyperglycemia: Secondary | ICD-10-CM | POA: Diagnosis not present

## 2023-10-15 DIAGNOSIS — E785 Hyperlipidemia, unspecified: Secondary | ICD-10-CM | POA: Diagnosis not present

## 2023-10-15 DIAGNOSIS — E1169 Type 2 diabetes mellitus with other specified complication: Secondary | ICD-10-CM

## 2023-10-15 DIAGNOSIS — I10 Essential (primary) hypertension: Secondary | ICD-10-CM | POA: Diagnosis not present

## 2023-10-15 DIAGNOSIS — Z7984 Long term (current) use of oral hypoglycemic drugs: Secondary | ICD-10-CM

## 2023-10-15 DIAGNOSIS — E119 Type 2 diabetes mellitus without complications: Secondary | ICD-10-CM

## 2023-10-15 DIAGNOSIS — Z23 Encounter for immunization: Secondary | ICD-10-CM

## 2023-10-15 MED ORDER — TRIAMTERENE-HCTZ 37.5-25 MG PO TABS: 1.0000 | ORAL_TABLET | Freq: Every day | ORAL | 1 refills | Status: DC

## 2023-10-15 MED ORDER — ROSUVASTATIN CALCIUM 10 MG PO TABS: 10.0000 mg | ORAL_TABLET | Freq: Every day | ORAL | 1 refills | Status: AC

## 2023-10-15 MED ORDER — METFORMIN HCL 500 MG PO TABS: 500.0000 mg | ORAL_TABLET | Freq: Every day | ORAL | 3 refills | Status: DC

## 2023-10-15 MED ORDER — PERINDOPRIL ERBUMINE 4 MG PO TABS: 6.0000 mg | ORAL_TABLET | Freq: Every day | ORAL | 3 refills | Status: DC

## 2023-10-15 NOTE — Patient Instructions (Signed)

## 2023-10-15 NOTE — Assessment & Plan Note (Signed)
 Hgba1c to be checked , minimize simple carbs. Increase exercise as tolerated. Continue current meds

## 2023-10-15 NOTE — Progress Notes (Signed)
Established Patient Office Visit  Subjective   Patient ID: Marissa Leblanc, female    DOB: 04/23/1956  Age: 68 y.o. MRN: [id number]  Chief Complaint  Patient presents with   Diabetes   Hyperlipidemia   Follow-up    HPI  Patient Active Problem List   Diagnosis Date Noted   Acute vaginitis 08/29/2022   Bronchitis 08/11/2022   Acute pain of right knee 03/04/2020   Hyperlipidemia associated with type 2 diabetes mellitus (HCC) 03/04/2020   Uncontrolled type 2 diabetes mellitus with hyperglycemia (HCC) 03/04/2020   Close exposure to COVID-19 virus 03/09/2019   Acute serous otitis media of left ear 04/12/2017   Type 2 diabetes mellitus without complication, without long-term current use of insulin (HCC) 01/21/2017   Obesity (BMI 30-39.9) 05/10/2013   KNEE PAIN, LEFT 08/22/2010   Asymptomatic postmenopausal status 04/03/2010   Open wound of finger 05/21/2009   Acute upper respiratory infection 08/24/2007   Essential hypertension 12/08/2006   Past Medical History:  Diagnosis Date   Hypertension    Past Surgical History:  Procedure Laterality Date   TEAR DUCT PROBING     Social History   Tobacco Use   Smoking status: Never   Smokeless tobacco: Never  Substance Use Topics   Alcohol use: Yes    Alcohol/week: 0.0 standard drinks of alcohol    Comment: rare   Drug use: No   Social History   Socioeconomic History   Marital status: Divorced    Spouse name: Not on file   Number of children: Not on file   Years of education: Not on file   Highest education level: Some college, no degree  Occupational History   Occupation: [employer]  Tobacco Use   Smoking status: Never   Smokeless tobacco: Never  Substance and Sexual Activity   Alcohol use: Yes    Alcohol/week: 0.0 standard drinks of alcohol    Comment: rare   Drug use: No   Sexual activity: Not Currently    Partners: Male  Other Topics Concern   Not on file  Social History Narrative   Exercise-- treadmill 10 min a  day --- trying to increase gradually   Social Drivers of Health   Financial Resource Strain: Low Risk  (10/15/2023)   Overall Financial Resource Strain (CARDIA)    Difficulty of Paying Living Expenses: Not hard at all  Food Insecurity: No Food Insecurity (10/15/2023)   Hunger Vital Sign    Worried About Running Out of Food in the Last Year: Never true    Ran Out of Food in the Last Year: Never true  Transportation Needs: No Transportation Needs (10/15/2023)   PRAPARE - Administrator, Civil Service (Medical): No    Lack of Transportation (Non-Medical): No  Physical Activity: Unknown (10/15/2023)   Exercise Vital Sign    Days of Exercise per Week: 0 days    Minutes of Exercise per Session: Not on file  Stress: No Stress Concern Present (10/15/2023)   [employer] of Occupational Health - Occupational Stress Questionnaire    Feeling of Stress : Not at all  Social Connections: Moderately Integrated (10/15/2023)   Social Connection and Isolation Panel [NHANES]    Frequency of Communication with Friends and Family: Three times a week    Frequency of Social Gatherings with Friends and Family: Once a week    Attends Religious Services: More than 4 times per year    Active Member of Clubs or Organizations: Yes  Attends Engineer, structural: More than 4 times per year    Marital Status: Divorced  Catering manager Violence: Not on file   Family Status  Relation Name Status   Mother  Alive   Father  Deceased       PE   Sister  Alive   MGM  (Not Specified)   Other  (Not Specified)   Daughter  Deceased  No partnership data on file   Family History  Problem Relation Age of Onset   Hypertension Mother    Hypertension Father    Stroke Father    Hypertension Sister    Diabetes Sister    Angina Maternal Grandmother    Heart attack Maternal Grandmother    Diabetes Other    Allergies  Allergen Reactions   Penicillins Hives      Review of Systems   Constitutional:  Negative for chills, fever and malaise/fatigue.  HENT:  Negative for congestion and hearing loss.   Eyes:  Negative for blurred vision and discharge.  Respiratory:  Negative for cough, sputum production and shortness of breath.   Cardiovascular:  Negative for chest pain, palpitations and leg swelling.  Gastrointestinal:  Negative for abdominal pain, blood in stool, constipation, diarrhea, heartburn, nausea and vomiting.  Genitourinary:  Negative for dysuria, frequency, hematuria and urgency.  Musculoskeletal:  Negative for back pain, falls and myalgias.  Skin:  Negative for rash.  Neurological:  Negative for dizziness, sensory change, loss of consciousness, weakness and headaches.  Endo/Heme/Allergies:  Negative for environmental allergies. Does not bruise/bleed easily.  Psychiatric/Behavioral:  Negative for depression and suicidal ideas. The patient is not nervous/anxious and does not have insomnia.       Objective:     BP 118/70 (BP Location: Right Arm, Patient Position: Sitting, Cuff Size: Large)   Pulse 74   Temp 97.7 F (36.5 C) (Oral)   Resp 18   Ht 5\' 5"  (1.651 m)   Wt 224 lb 6.4 oz (101.8 kg)   LMP 10/20/2010   SpO2 99%   BMI 37.34 kg/m  BP Readings from Last 3 Encounters:  10/15/23 118/70  04/08/23 110/60  08/28/22 118/70   Wt Readings from Last 3 Encounters:  10/15/23 224 lb 6.4 oz (101.8 kg)  04/08/23 211 lb 3.2 oz (95.8 kg)  08/28/22 231 lb 6.4 oz (105 kg)   SpO2 Readings from Last 3 Encounters:  10/15/23 99%  04/08/23 97%  08/28/22 98%      Physical Exam Vitals and nursing note reviewed.  Constitutional:      General: [name] not in acute distress.    Appearance: Normal appearance. [name] well-developed.  HENT:     Head: Normocephalic and atraumatic.     Right Ear: Tympanic membrane, ear canal and external ear normal. There is no impacted cerumen.     Left Ear: Tympanic membrane, ear canal and external ear normal. There is no  impacted cerumen.     Nose: Nose normal.     Mouth/Throat:     Mouth: Mucous membranes are moist.     Pharynx: Oropharynx is clear. No oropharyngeal exudate or posterior oropharyngeal erythema.  Eyes:     General: No scleral icterus.       Right eye: No discharge.        Left eye: No discharge.     Conjunctiva/sclera: Conjunctivae normal.     Pupils: Pupils are equal, round, and reactive to light.  Neck:     Thyroid: No thyromegaly  or thyroid tenderness.     Vascular: No JVD.  Cardiovascular:     Rate and Rhythm: Normal rate and regular rhythm.     Heart sounds: Normal heart sounds. No murmur heard. Pulmonary:     Effort: Pulmonary effort is normal. No respiratory distress.     Breath sounds: Normal breath sounds.  Abdominal:     General: Bowel sounds are normal. There is no distension.     Palpations: Abdomen is soft. There is no mass.     Tenderness: There is no abdominal tenderness. There is no guarding or rebound.  Genitourinary:    Vagina: Normal.  Musculoskeletal:        General: Normal range of motion.     Cervical back: Normal range of motion and neck supple.     Right lower leg: No edema.     Left lower leg: No edema.  Lymphadenopathy:     Cervical: No cervical adenopathy.  Skin:    General: Skin is warm and dry.     Findings: No erythema or rash.  Neurological:     Mental Status: [name] alert and oriented to person, place, and time.     Cranial Nerves: No cranial nerve deficit.     Deep Tendon Reflexes: Reflexes are normal and symmetric.  Psychiatric:        Mood and Affect: Mood normal.        Behavior: Behavior normal.        Thought Content: Thought content normal.        Judgment: Judgment normal.      No results found for any visits on 10/15/23.  Last CBC Lab Results  Component Value Date   WBC 5.6 04/08/2023   HGB 12.5 04/08/2023   HCT 41.2 04/08/2023   MCV 64.2 Repeated and verified X2. (L) 04/08/2023   RDW 15.5 04/08/2023   PLT 287.0  04/08/2023   Last metabolic panel Lab Results  Component Value Date   GLUCOSE 119 (H) 04/08/2023   NA 140 04/08/2023   K 3.6 04/08/2023   CL 99 04/08/2023   CO2 33 (H) 04/08/2023   BUN 18 04/08/2023   CREATININE 1.18 04/08/2023   GFR 48.01 (L) 04/08/2023   CALCIUM 9.7 04/08/2023   PROT 6.9 04/08/2023   ALBUMIN 4.5 04/08/2023   BILITOT 1.0 04/08/2023   ALKPHOS 53 04/08/2023   AST 11 04/08/2023   ALT 8 04/08/2023   Last lipids Lab Results  Component Value Date   CHOL 190 04/08/2023   HDL 54.40 04/08/2023   LDLCALC 120 (H) 04/08/2023   LDLDIRECT 116.7 04/03/2010   TRIG 77.0 04/08/2023   CHOLHDL 3 04/08/2023   Last hemoglobin A1c Lab Results  Component Value Date   HGBA1C 6.2 04/08/2023   Last thyroid functions Lab Results  Component Value Date   TSH 2.65 05/06/2021   Last vitamin D Lab Results  Component Value Date   VD25OH 22 (L) 04/03/2010   Last vitamin B12 and Folate Lab Results  Component Value Date   VITAMINB12 232 12/20/2017      The 10-year ASCVD risk score (Arnett DK, et al., 2019) is: 14.4%    Assessment & Plan:   Problem List Items Addressed This Visit       Unprioritized   Type 2 diabetes mellitus without complication, without long-term current use of insulin (HCC)   Relevant Medications   metFORMIN (GLUCOPHAGE) 500 MG tablet   perindopril (ACEON) 4 MG tablet   rosuvastatin (CRESTOR) 10  MG tablet   Uncontrolled type 2 diabetes mellitus with hyperglycemia (HCC)   Hgba1c to be checked , minimize simple carbs. Increase exercise as tolerated. Continue current meds       Relevant Medications   metFORMIN (GLUCOPHAGE) 500 MG tablet   perindopril (ACEON) 4 MG tablet   rosuvastatin (CRESTOR) 10 MG tablet   Hyperlipidemia associated with type 2 diabetes mellitus (HCC)   Encourage heart healthy diet such as MIND or DASH diet, increase exercise, avoid trans fats, simple carbohydrates and processed foods, consider a krill or fish or flaxseed  oil cap daily.        Relevant Medications   metFORMIN (GLUCOPHAGE) 500 MG tablet   perindopril (ACEON) 4 MG tablet   rosuvastatin (CRESTOR) 10 MG tablet   triamterene-hydrochlorothiazide (MAXZIDE-25) 37.5-25 MG tablet   Other Relevant Orders   Lipid panel   Comprehensive metabolic panel   Essential hypertension - Primary   Well controlled, no changes to meds. Encouraged heart healthy diet such as the DASH diet and exercise as tolerated.        Relevant Medications   perindopril (ACEON) 4 MG tablet   rosuvastatin (CRESTOR) 10 MG tablet   triamterene-hydrochlorothiazide (MAXZIDE-25) 37.5-25 MG tablet   Other Relevant Orders   Lipid panel   CBC with Differential/Platelet   Comprehensive metabolic panel   Microalbumin / creatinine urine ratio   Other Visit Diagnoses       Type 2 diabetes mellitus with hyperglycemia, without long-term current use of insulin (HCC)       Relevant Medications   metFORMIN (GLUCOPHAGE) 500 MG tablet   perindopril (ACEON) 4 MG tablet   rosuvastatin (CRESTOR) 10 MG tablet   Other Relevant Orders   Comprehensive metabolic panel   Hemoglobin A1c   Microalbumin / creatinine urine ratio     Morbid obesity (HCC)       Relevant Medications   metFORMIN (GLUCOPHAGE) 500 MG tablet     Need for influenza vaccination       Relevant Orders   Flu Vaccine Trivalent High Dose (Fluad) (Completed)                  Return in about 6 months (around 04/13/2024), or if symptoms worsen or fail to improve.    Donato Schultz, DO

## 2023-10-15 NOTE — Assessment & Plan Note (Signed)
 Well controlled, no changes to meds. Encouraged heart healthy diet such as the DASH diet and exercise as tolerated.

## 2023-10-15 NOTE — Assessment & Plan Note (Signed)
 Encourage heart healthy diet such as MIND or DASH diet, increase exercise, avoid trans fats, simple carbohydrates and processed foods, consider a krill or fish or flaxseed oil cap daily.

## 2023-10-16 LAB — CBC WITH DIFFERENTIAL/PLATELET
Absolute Lymphocytes: 2055 {cells}/uL (ref 850–3900)
Absolute Monocytes: 338 {cells}/uL (ref 200–950)
Basophils Absolute: 53 {cells}/uL (ref 0–200)
Basophils Relative: 0.7 %
Eosinophils Absolute: 158 {cells}/uL (ref 15–500)
Eosinophils Relative: 2.1 %
HCT: 40.5 % (ref 35.0–45.0)
Hemoglobin: 12.5 g/dL (ref 11.7–15.5)
MCH: 19.7 pg — ABNORMAL LOW (ref 27.0–33.0)
MCHC: 30.9 g/dL — ABNORMAL LOW (ref 32.0–36.0)
MCV: 63.8 fL — ABNORMAL LOW (ref 80.0–100.0)
MPV: 9.9 fL (ref 7.5–12.5)
Monocytes Relative: 4.5 %
Neutro Abs: 4898 {cells}/uL (ref 1500–7800)
Neutrophils Relative %: 65.3 %
Platelets: 348 10*3/uL (ref 140–400)
RBC: 6.35 10*6/uL — ABNORMAL HIGH (ref 3.80–5.10)
RDW: 17.2 % — ABNORMAL HIGH (ref 11.0–15.0)
Total Lymphocyte: 27.4 %
WBC: 7.5 10*3/uL (ref 3.8–10.8)

## 2023-10-16 LAB — COMPREHENSIVE METABOLIC PANEL
AG Ratio: 1.8 (calc) (ref 1.0–2.5)
ALT: 11 U/L (ref 6–29)
AST: 11 U/L (ref 10–35)
Albumin: 4.6 g/dL (ref 3.6–5.1)
Alkaline phosphatase (APISO): 56 U/L (ref 37–153)
BUN: 16 mg/dL (ref 7–25)
CO2: 29 mmol/L (ref 20–32)
Calcium: 9.7 mg/dL (ref 8.6–10.4)
Chloride: 98 mmol/L (ref 98–110)
Creat: 1.05 mg/dL (ref 0.50–1.05)
Globulin: 2.5 g/dL (ref 1.9–3.7)
Glucose, Bld: 95 mg/dL (ref 65–99)
Potassium: 3.8 mmol/L (ref 3.5–5.3)
Sodium: 137 mmol/L (ref 135–146)
Total Bilirubin: 0.9 mg/dL (ref 0.2–1.2)
Total Protein: 7.1 g/dL (ref 6.1–8.1)

## 2023-10-16 LAB — LIPID PANEL
Cholesterol: 196 mg/dL (ref <200)
HDL: 56 mg/dL (ref >=50)
LDL Cholesterol (Calc): 117 mg/dL — ABNORMAL HIGH
Non-HDL Cholesterol (Calc): 140 mg/dL — ABNORMAL HIGH (ref <130)
Total CHOL/HDL Ratio: 3.5 (calc) (ref <5.0)
Triglycerides: 118 mg/dL (ref <150)

## 2023-10-16 LAB — MICROALBUMIN / CREATININE URINE RATIO
Creatinine, Urine: 63 mg/dL (ref 20–275)
Microalb Creat Ratio: 35 mg/g{creat} — ABNORMAL HIGH (ref <30)
Microalb, Ur: 2.2 mg/dL

## 2023-10-16 LAB — HEMOGLOBIN A1C
Hgb A1c MFr Bld: 6.3 %{Hb} — ABNORMAL HIGH (ref <5.7)
Mean Plasma Glucose: 134 mg/dL
eAG (mmol/L): 7.4 mmol/L

## 2023-10-19 ENCOUNTER — Encounter: Payer: Self-pay | Admitting: Family Medicine

## 2023-11-11 ENCOUNTER — Other Ambulatory Visit: Payer: Self-pay | Admitting: Family Medicine

## 2023-11-11 DIAGNOSIS — I10 Essential (primary) hypertension: Secondary | ICD-10-CM

## 2023-11-11 MED ORDER — PERINDOPRIL ERBUMINE 4 MG PO TABS: 6.0000 mg | ORAL_TABLET | Freq: Every day | ORAL | 1 refills | Status: DC

## 2023-11-11 NOTE — Telephone Encounter (Signed)
 Copied from CRM [id number]. Topic: Clinical - Medication Refill >> Nov 11, 2023 12:31 PM Eunice Blase wrote: Most Recent Primary Care Visit:  Provider: Seabron Spates R  Department: [facility]  Visit Type: OFFICE VISIT  Date: 10/15/2023  Medication: perindopril (ACEON) 4 MG tablet  Has the patient contacted their pharmacy? Yes (Agent: If no, request that the patient contact the pharmacy for the refill. If patient does not wish to contact the pharmacy document the reason why and proceed with request.) (Agent: If yes, when and what did the pharmacy advise?)  Is this the correct pharmacy for this prescription? Yes If no, delete pharmacy and type the correct one.  This is the patient's preferred pharmacy:  [id number] [place], [place] - [phone] AT [facility] OF [facility] & [address] [place] [address] Phone: [phone] Fax: [phone]     Has the prescription been filled recently? Yes  Is the patient out of the medication? Yes  Has the patient been seen for an appointment in the last year OR does the patient have an upcoming appointment? Yes  Can we respond through MyChart? Yes  Agent: Please be advised that Rx refills may take up to 3 business days. We ask that you follow-up with your pharmacy.

## 2023-12-20 ENCOUNTER — Other Ambulatory Visit: Payer: Self-pay | Admitting: Family Medicine

## 2023-12-20 DIAGNOSIS — E119 Type 2 diabetes mellitus without complications: Secondary | ICD-10-CM

## 2024-05-22 ENCOUNTER — Other Ambulatory Visit: Payer: Self-pay | Admitting: Family Medicine

## 2024-05-22 DIAGNOSIS — Z1231 Encounter for screening mammogram for malignant neoplasm of breast: Secondary | ICD-10-CM

## 2024-05-26 ENCOUNTER — Ambulatory Visit
Admission: RE | Admit: 2024-05-26 | Discharge: 2024-05-26 | Disposition: A | Discharge status: Home / Self Care | Source: Ambulatory Visit | Attending: Family Medicine | Admitting: Family Medicine

## 2024-05-26 DIAGNOSIS — Z1231 Encounter for screening mammogram for malignant neoplasm of breast: Secondary | ICD-10-CM

## 2024-06-08 DIAGNOSIS — H2513 Age-related nuclear cataract, bilateral: Secondary | ICD-10-CM | POA: Diagnosis not present

## 2024-06-08 DIAGNOSIS — H35413 Lattice degeneration of retina, bilateral: Secondary | ICD-10-CM | POA: Diagnosis not present

## 2024-06-08 DIAGNOSIS — E119 Type 2 diabetes mellitus without complications: Secondary | ICD-10-CM | POA: Diagnosis not present

## 2024-06-08 DIAGNOSIS — H43813 Vitreous degeneration, bilateral: Secondary | ICD-10-CM | POA: Diagnosis not present

## 2024-06-09 ENCOUNTER — Telehealth: Payer: Self-pay | Admitting: Family Medicine

## 2024-06-09 DIAGNOSIS — I10 Essential (primary) hypertension: Secondary | ICD-10-CM

## 2024-06-09 NOTE — Telephone Encounter (Signed)
 Copied from CRM [id number]. Topic: Clinical - Medication Refill >> Jun 09, 2024  5:12 PM Harlene ORN wrote: Medication: perindopril  (ACEON ) 4 MG tablet  Has the patient contacted their pharmacy? Yes (Agent: If no, request that the patient contact the pharmacy for the refill. If patient does not wish to contact the pharmacy document the reason why and proceed with request.) (Agent: If yes, when and what did the pharmacy advise?)  This is the patient's preferred pharmacy:  [facility] #[id number] [place], [place] - [address] AT [facility] OF [facility] & [address] [place] [address] Phone: [phone] Fax: [phone]  Is this the correct pharmacy for this prescription? Yes If no, delete pharmacy and type the correct one.   Has the prescription been filled recently? No  Is the patient out of the medication? Yes  Has the patient been seen for an appointment in the last year OR does the patient have an upcoming appointment? Yes  Can we respond through MyChart? Yes  Agent: Please be advised that Rx refills may take up to 3 business days. We ask that you follow-up with your pharmacy.

## 2024-06-12 MED ORDER — PERINDOPRIL ERBUMINE 4 MG PO TABS: 6.0000 mg | ORAL_TABLET | Freq: Every day | ORAL | 1 refills | Status: AC

## 2024-06-12 NOTE — Telephone Encounter (Signed)
 Refill sent.

## 2024-06-13 ENCOUNTER — Ambulatory Visit: Admitting: Family Medicine

## 2024-06-13 VITALS — BP 122/76 | HR 67 | Temp 97.7°F | Resp 12 | Ht 65.0 in | Wt 241.8 lb

## 2024-06-13 DIAGNOSIS — I10 Essential (primary) hypertension: Secondary | ICD-10-CM

## 2024-06-13 DIAGNOSIS — E785 Hyperlipidemia, unspecified: Secondary | ICD-10-CM | POA: Diagnosis not present

## 2024-06-13 DIAGNOSIS — E1165 Type 2 diabetes mellitus with hyperglycemia: Secondary | ICD-10-CM

## 2024-06-13 DIAGNOSIS — E1169 Type 2 diabetes mellitus with other specified complication: Secondary | ICD-10-CM

## 2024-06-13 DIAGNOSIS — Z23 Encounter for immunization: Secondary | ICD-10-CM | POA: Diagnosis not present

## 2024-06-13 DIAGNOSIS — E119 Type 2 diabetes mellitus without complications: Secondary | ICD-10-CM

## 2024-06-13 DIAGNOSIS — Z7984 Long term (current) use of oral hypoglycemic drugs: Secondary | ICD-10-CM

## 2024-06-13 MED ORDER — ACCU-CHEK FASTCLIX LANCETS MISC: 1 refills | Status: AC

## 2024-06-13 MED ORDER — TRIAMTERENE-HCTZ 37.5-25 MG PO TABS: 1.0000 | ORAL_TABLET | Freq: Every day | ORAL | 1 refills | Status: AC

## 2024-06-13 MED ORDER — METFORMIN HCL 500 MG PO TABS: 500.0000 mg | ORAL_TABLET | Freq: Every day | ORAL | 1 refills | Status: DC

## 2024-06-13 NOTE — Assessment & Plan Note (Signed)
 Encourage heart healthy diet such as MIND or DASH diet, increase exercise, avoid trans fats, simple carbohydrates and processed foods, consider a krill or fish or flaxseed oil cap daily.  Pt has not been taking the crestor 

## 2024-06-13 NOTE — Progress Notes (Signed)
 Subjective:    Patient ID: Marissa Leblanc, female    DOB: [birth year], 68 y.o.   MRN: [id number]  No chief complaint on file.   HPI Patient is in today for f/u chol, dm, htn.  Discussed the use of AI scribe software for clinical note transcription with the patient, who gave verbal consent to proceed.  History of Present Illness Marissa Leblanc is a 68 year old female with diabetes who presents for a routine follow-up and flu vaccination.  She has discontinued her cholesterol medication due to experiencing soreness. [name] currently taking metformin  and Maxzide (triamterene /hydrochlorothiazide) and uses lancets. Her prescriptions for these medications have been refilled.  She experiences occasional swelling in her ankles, which she associates with dietary salt intake, such as after eating spaghetti.  She received her flu shot today and confirms having received shingles vaccinations in 2022 and 2023. [name] not due for a tetanus shot until 2029.  She visited the eye doctor last week at [facility], where she was seen by Dr. Halston, although her usual doctor is Dr. Rozena.   Past Medical History:  Diagnosis Date   Hypertension     Past Surgical History:  Procedure Laterality Date   TEAR DUCT PROBING      Family History  Problem Relation Age of Onset   Hypertension Mother    Hypertension Father    Stroke Father    Hypertension Sister    Diabetes Sister    Angina Maternal Grandmother    Heart attack Maternal Grandmother    Diabetes Other     Social History   Socioeconomic History   Marital status: Divorced    Spouse name: Not on file   Number of children: Not on file   Years of education: Not on file   Highest education level: 12th grade  Occupational History   Occupation: [employer]  Tobacco Use   Smoking status: Never   Smokeless tobacco: Never  Substance and Sexual Activity   Alcohol use: Yes    Alcohol/week: 0.0 standard drinks of alcohol    Comment: rare    Drug use: No   Sexual activity: Not Currently    Partners: Male  Other Topics Concern   Not on file  Social History Narrative   Exercise-- treadmill 10 min a day --- trying to increase gradually   Social Drivers of Health   Financial Resource Strain: Low Risk  (06/13/2024)   Overall Financial Resource Strain (CARDIA)    Difficulty of Paying Living Expenses: Not hard at all  Food Insecurity: No Food Insecurity (06/13/2024)   Hunger Vital Sign    Worried About Running Out of Food in the Last Year: Never true    Ran Out of Food in the Last Year: Never true  Transportation Needs: No Transportation Needs (06/13/2024)   PRAPARE - Administrator, Civil Service (Medical): No    Lack of Transportation (Non-Medical): No  Physical Activity: Insufficiently Active (06/13/2024)   Exercise Vital Sign    Days of Exercise per Week: 1 day    Minutes of Exercise per Session: 60 min  Stress: No Stress Concern Present (06/13/2024)   [employer] of Occupational Health - Occupational Stress Questionnaire    Feeling of Stress: Not at all  Social Connections: Moderately Integrated (06/13/2024)   Social Connection and Isolation Panel    Frequency of Communication with Friends and Family: Three times a week    Frequency of Social Gatherings with Friends and  Family: Twice a week    Attends Religious Services: More than 4 times per year    Active Member of Clubs or Organizations: Yes    Attends Engineer, structural: More than 4 times per year    Marital Status: Divorced  Catering manager Violence: Not on file    Outpatient Medications Prior to Visit  Medication Sig Dispense Refill   ACCU-CHEK GUIDE test strip Check blood sugar twice daily. Dx: E11.9 100 each 12   blood glucose meter kit and supplies KIT Dispense based on patient and insurance preference. Use up to four times daily as directed. (FOR ICD-9 250.00, 250.01). 1 each 0   perindopril  (ACEON ) 4 MG tablet Take 1.5 tablets (6  mg total) by mouth daily. 135 tablet 1   Accu-Chek FastClix Lancets MISC Check blood sugar twice daily. Dx:E11.9 306 each 1   metFORMIN  (GLUCOPHAGE ) 500 MG tablet Take 1 tablet (500 mg total) by mouth daily with breakfast. 90 tablet 0   triamterene -hydrochlorothiazide (MAXZIDE-25) 37.5-25 MG tablet Take 1 tablet by mouth daily. 90 tablet 1   rosuvastatin  (CRESTOR ) 10 MG tablet Take 1 tablet (10 mg total) by mouth at bedtime. (Patient not taking: Reported on 06/13/2024) 90 tablet 1   No facility-administered medications prior to visit.    Allergies  Allergen Reactions   Penicillins Hives    Review of Systems  Constitutional:  Negative for fever and malaise/fatigue.  HENT:  Negative for congestion.   Eyes:  Negative for blurred vision.  Respiratory:  Negative for cough and shortness of breath.   Cardiovascular:  Negative for chest pain, palpitations and leg swelling.  Gastrointestinal:  Negative for abdominal pain, blood in stool, nausea and vomiting.  Genitourinary:  Negative for dysuria and frequency.  Musculoskeletal:  Negative for back pain and falls.  Skin:  Negative for rash.  Neurological:  Negative for dizziness, loss of consciousness and headaches.  Endo/Heme/Allergies:  Negative for environmental allergies.  Psychiatric/Behavioral:  Negative for depression. The patient is not nervous/anxious.        Objective:    Physical Exam Vitals and nursing note reviewed.  Constitutional:      General: [name] not in acute distress.    Appearance: Normal appearance. [name] well-developed.  HENT:     Head: Normocephalic and atraumatic.  Eyes:     General: No scleral icterus.       Right eye: No discharge.        Left eye: No discharge.  Cardiovascular:     Rate and Rhythm: Normal rate and regular rhythm.     Heart sounds: No murmur heard. Pulmonary:     Effort: Pulmonary effort is normal. No respiratory distress.     Breath sounds: Normal breath sounds.  Musculoskeletal:         General: Normal range of motion.     Cervical back: Normal range of motion and neck supple.     Right lower leg: No edema.     Left lower leg: No edema.  Skin:    General: Skin is warm and dry.  Neurological:     Mental Status: [name] alert and oriented to person, place, and time.  Psychiatric:        Mood and Affect: Mood normal.        Behavior: Behavior normal.        Thought Content: Thought content normal.        Judgment: Judgment normal.     BP 122/76 (BP  Location: Right Arm, Patient Position: Sitting, Cuff Size: Large)   Pulse 67   Temp 97.7 F (36.5 C) (Oral)   Resp 12   Ht 5' 5 (1.651 m)   Wt 241 lb 12.8 oz (109.7 kg)   LMP 10/20/2010   SpO2 100%   BMI 40.24 kg/m  Wt Readings from Last 3 Encounters:  06/13/24 241 lb 12.8 oz (109.7 kg)  10/15/23 224 lb 6.4 oz (101.8 kg)  04/08/23 211 lb 3.2 oz (95.8 kg)    Diabetic Foot Exam - Simple   No data filed    Lab Results  Component Value Date   WBC 7.5 10/15/2023   HGB 12.5 10/15/2023   HCT 40.5 10/15/2023   PLT 348 10/15/2023   GLUCOSE 95 10/15/2023   CHOL 196 10/15/2023   TRIG 118 10/15/2023   HDL 56 10/15/2023   LDLDIRECT 116.7 04/03/2010   LDLCALC 117 (H) 10/15/2023   ALT 11 10/15/2023   AST 11 10/15/2023   NA 137 10/15/2023   K 3.8 10/15/2023   CL 98 10/15/2023   CREATININE 1.05 10/15/2023   BUN 16 10/15/2023   CO2 29 10/15/2023   TSH 2.65 05/06/2021   HGBA1C 6.3 (H) 10/15/2023   MICROALBUR 2.2 10/15/2023    Lab Results  Component Value Date   TSH 2.65 05/06/2021   Lab Results  Component Value Date   WBC 7.5 10/15/2023   HGB 12.5 10/15/2023   HCT 40.5 10/15/2023   MCV 63.8 (L) 10/15/2023   PLT 348 10/15/2023   Lab Results  Component Value Date   NA 137 10/15/2023   K 3.8 10/15/2023   CO2 29 10/15/2023   GLUCOSE 95 10/15/2023   BUN 16 10/15/2023   CREATININE 1.05 10/15/2023   BILITOT 0.9 10/15/2023   ALKPHOS 53 04/08/2023   AST 11 10/15/2023   ALT 11 10/15/2023   PROT  7.1 10/15/2023   ALBUMIN 4.5 04/08/2023   CALCIUM  9.7 10/15/2023   GFR 48.01 (L) 04/08/2023   Lab Results  Component Value Date   CHOL 196 10/15/2023   Lab Results  Component Value Date   HDL 56 10/15/2023   Lab Results  Component Value Date   LDLCALC 117 (H) 10/15/2023   Lab Results  Component Value Date   TRIG 118 10/15/2023   Lab Results  Component Value Date   CHOLHDL 3.5 10/15/2023   Lab Results  Component Value Date   HGBA1C 6.3 (H) 10/15/2023       Assessment & Plan:  Hyperlipidemia associated with type 2 diabetes mellitus (HCC) Assessment & Plan: Encourage heart healthy diet such as MIND or DASH diet, increase exercise, avoid trans fats, simple carbohydrates and processed foods, consider a krill or fish or flaxseed oil cap daily.  Pt has not been taking the crestor    Orders: -     Comprehensive metabolic panel with GFR -     Lipid panel  Type 2 diabetes mellitus without complication, without long-term current use of insulin (HCC) -     Accu-Chek FastClix Lancets; Check blood sugar twice daily. Dx:E11.9  Dispense: 306 each; Refill: 1 -     metFORMIN  HCl; Take 1 tablet (500 mg total) by mouth daily with breakfast.  Dispense: 90 tablet; Refill: 1  Essential hypertension Assessment & Plan: Well controlled, no changes to meds. Encouraged heart healthy diet such as the DASH diet and exercise as tolerated.    Orders: -     Triamterene -HCTZ; Take 1 tablet by mouth daily.  Dispense: 90 tablet; Refill: 1 -     CBC with Differential/Platelet  Type 2 diabetes mellitus with hyperglycemia, without long-term current use of insulin (HCC) -     Comprehensive metabolic panel with GFR -     Lipid panel -     Hemoglobin A1c  Influenza vaccine administered -     Flu vaccine HIGH DOSE PF(Fluzone Trivalent)  Uncontrolled type 2 diabetes mellitus with hyperglycemia (HCC) Assessment & Plan: hgba1c to be checked, minimize simple carbs. Increase exercise as tolerated.  Continue current meds    Assessment and Plan Assessment & Plan Type 2 diabetes mellitus   Ongoing management of type 2 diabetes mellitus requires checking A1c to assess current glycemic control. Order A1c test and refill metformin  prescription.  Essential hypertension   Essential hypertension is managed with triamterene /hydrochlorothiazide (Maxzide). Blood pressure is well-controlled. Occasional ankle swelling may be related to dietary salt intake. Refill triamterene /hydrochlorothiazide prescription.   Raphaella Larkin R Lowne Chase, DO

## 2024-06-13 NOTE — Assessment & Plan Note (Signed)
 Well controlled, no changes to meds. Encouraged heart healthy diet such as the DASH diet and exercise as tolerated.

## 2024-06-13 NOTE — Assessment & Plan Note (Signed)
 hgba1c to be checked, minimize simple carbs. Increase exercise as tolerated. Continue current meds

## 2024-06-14 LAB — CBC WITH DIFFERENTIAL/PLATELET
Basophils Absolute: 0.1 K/uL (ref 0.0–0.1)
Basophils Relative: 1 % (ref 0.0–3.0)
Eosinophils Absolute: 0.2 K/uL (ref 0.0–0.7)
Eosinophils Relative: 2.5 % (ref 0.0–5.0)
HCT: 38.9 % (ref 36.0–46.0)
Hemoglobin: 12.2 g/dL (ref 12.0–15.0)
Lymphocytes Relative: 32.6 % (ref 12.0–46.0)
Lymphs Abs: 2.4 K/uL (ref 0.7–4.0)
MCHC: 31.5 g/dL (ref 30.0–36.0)
MCV: 63.6 fl — ABNORMAL LOW (ref 78.0–100.0)
Monocytes Absolute: 0.4 K/uL (ref 0.1–1.0)
Monocytes Relative: 4.8 % (ref 3.0–12.0)
Neutro Abs: 4.4 K/uL (ref 1.4–7.7)
Neutrophils Relative %: 59.1 % (ref 43.0–77.0)
Platelets: 257 K/uL (ref 150.0–400.0)
RBC: 6.12 Mil/uL — ABNORMAL HIGH (ref 3.87–5.11)
RDW: 15.8 % — ABNORMAL HIGH (ref 11.5–15.5)
WBC: 7.4 K/uL (ref 4.0–10.5)

## 2024-06-14 LAB — LIPID PANEL
Cholesterol: 177 mg/dL (ref 0–200)
HDL: 48.6 mg/dL (ref >39.00)
LDL Cholesterol: 109 mg/dL — ABNORMAL HIGH (ref 0–99)
NonHDL: 127.92
Total CHOL/HDL Ratio: 4
Triglycerides: 96 mg/dL (ref 0.0–149.0)
VLDL: 19.2 mg/dL (ref 0.0–40.0)

## 2024-06-14 LAB — COMPREHENSIVE METABOLIC PANEL WITH GFR
ALT: 10 U/L (ref 0–35)
AST: 12 U/L (ref 0–37)
Albumin: 4.4 g/dL (ref 3.5–5.2)
Alkaline Phosphatase: 50 U/L (ref 39–117)
BUN: 19 mg/dL (ref 6–23)
CO2: 31 meq/L (ref 19–32)
Calcium: 9.2 mg/dL (ref 8.4–10.5)
Chloride: 97 meq/L (ref 96–112)
Creatinine, Ser: 1.18 mg/dL (ref 0.40–1.20)
GFR: 47.61 mL/min — ABNORMAL LOW (ref >60.00)
Glucose, Bld: 121 mg/dL — ABNORMAL HIGH (ref 70–99)
Potassium: 4.2 meq/L (ref 3.5–5.1)
Sodium: 135 meq/L (ref 135–145)
Total Bilirubin: 0.8 mg/dL (ref 0.2–1.2)
Total Protein: 7.1 g/dL (ref 6.0–8.3)

## 2024-06-14 LAB — HEMOGLOBIN A1C: Hgb A1c MFr Bld: 7.2 % — ABNORMAL HIGH (ref 4.6–6.5)

## 2024-06-24 ENCOUNTER — Ambulatory Visit: Payer: Self-pay | Admitting: Family Medicine

## 2024-06-24 ENCOUNTER — Other Ambulatory Visit: Payer: Self-pay | Admitting: Family Medicine

## 2024-06-24 DIAGNOSIS — I10 Essential (primary) hypertension: Secondary | ICD-10-CM

## 2024-06-24 DIAGNOSIS — E1169 Type 2 diabetes mellitus with other specified complication: Secondary | ICD-10-CM

## 2024-06-24 DIAGNOSIS — E119 Type 2 diabetes mellitus without complications: Secondary | ICD-10-CM

## 2024-06-28 ENCOUNTER — Other Ambulatory Visit: Payer: Self-pay

## 2024-06-28 MED ORDER — METFORMIN HCL 500 MG PO TABS: 500.0000 mg | ORAL_TABLET | Freq: Two times a day (BID) | ORAL | 2 refills | Status: AC

## 2024-06-28 MED ORDER — ROSUVASTATIN CALCIUM 20 MG PO TABS: 20.0000 mg | ORAL_TABLET | Freq: Every day | ORAL | 2 refills | Status: AC
